# Patient Record
Sex: Female | Born: 1955 | Race: Black or African American | Hispanic: No | Marital: Single | Smoking: Former smoker
Health system: Southern US, Academic
[De-identification: ages and names within clinical notes are randomized; demographics above are authoritative.]

## PROBLEM LIST (undated history)

## (undated) DIAGNOSIS — Z9889 Other specified postprocedural states: Secondary | ICD-10-CM

## (undated) DIAGNOSIS — I10 Essential (primary) hypertension: Secondary | ICD-10-CM

## (undated) DIAGNOSIS — G629 Polyneuropathy, unspecified: Secondary | ICD-10-CM

## (undated) DIAGNOSIS — G8929 Other chronic pain: Secondary | ICD-10-CM

## (undated) DIAGNOSIS — K760 Fatty (change of) liver, not elsewhere classified: Secondary | ICD-10-CM

## (undated) DIAGNOSIS — M797 Fibromyalgia: Secondary | ICD-10-CM

## (undated) DIAGNOSIS — K632 Fistula of intestine: Secondary | ICD-10-CM

## (undated) DIAGNOSIS — E079 Disorder of thyroid, unspecified: Secondary | ICD-10-CM

## (undated) DIAGNOSIS — R002 Palpitations: Secondary | ICD-10-CM

## (undated) DIAGNOSIS — M4800 Spinal stenosis, site unspecified: Secondary | ICD-10-CM

## (undated) DIAGNOSIS — E119 Type 2 diabetes mellitus without complications: Secondary | ICD-10-CM

## (undated) DIAGNOSIS — I499 Cardiac arrhythmia, unspecified: Secondary | ICD-10-CM

## (undated) DIAGNOSIS — R112 Nausea with vomiting, unspecified: Secondary | ICD-10-CM

## (undated) DIAGNOSIS — N189 Chronic kidney disease, unspecified: Secondary | ICD-10-CM

## (undated) DIAGNOSIS — Z87448 Personal history of other diseases of urinary system: Secondary | ICD-10-CM

## (undated) DIAGNOSIS — E041 Nontoxic single thyroid nodule: Secondary | ICD-10-CM

## (undated) DIAGNOSIS — K5792 Diverticulitis of intestine, part unspecified, without perforation or abscess without bleeding: Secondary | ICD-10-CM

## (undated) DIAGNOSIS — K219 Gastro-esophageal reflux disease without esophagitis: Secondary | ICD-10-CM

## (undated) DIAGNOSIS — D649 Anemia, unspecified: Secondary | ICD-10-CM

## (undated) DIAGNOSIS — E785 Hyperlipidemia, unspecified: Secondary | ICD-10-CM

## (undated) DIAGNOSIS — R6889 Other general symptoms and signs: Secondary | ICD-10-CM

## (undated) DIAGNOSIS — Z932 Ileostomy status: Secondary | ICD-10-CM

## (undated) HISTORY — PX: ANKLE SURGERY: SHX546

## (undated) HISTORY — PX: HX HYSTERECTOMY: SHX81

## (undated) HISTORY — PX: ILEOSTOMY: SHX1783

## (undated) HISTORY — PX: COLONOSCOPY: WVUENDOPRO10

---

## 2020-04-01 NOTE — Patient Instructions (Signed)
 Seek immediate emergency medical attention if you experience sudden vision loss or eye pain, severe or worsening abdominal pain, difficulty swallowing, stiff neck, shortness of breath, coughing or vomiting up blood, chest pain, increased fever, unexplained weight loss, or blood in stool.  Follow up with Primary Care Physician as scheduled.

## 2020-10-18 IMAGING — MR MRI LUMBAR SPINE WITHOUT CONTRAST
5 of 6 series · 32 of 48 positions shown · IV contrast (gadolinium)
Comparison: None available.

﻿EXAM:  [id number]   MRI LUMBAR SPINE WITHOUT CONTRAST
INDICATION: Chronic lower back pain with bilateral lower extremity radiculopathy.
TECHNIQUE: Multiplanar multisequential MRI of the lumbar spine was performed without gadolinium contrast.

[Series 15: T2 · sagittal · 4.0mm · 1.02mm/px · 6 of 13 slices shown (1 of 3)]
[im 1/13]
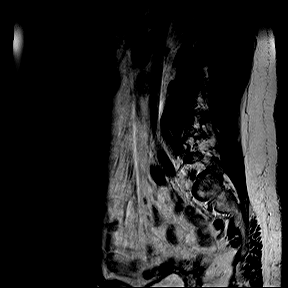
[im 3/13]
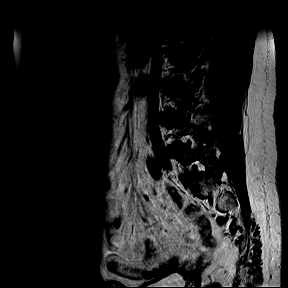
[im 5/13]
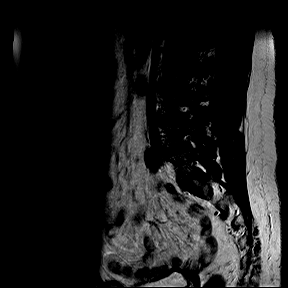
[im 8/13]
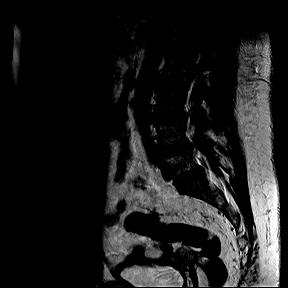
[im 10/13]
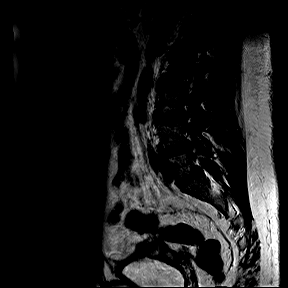
[im 13/13]
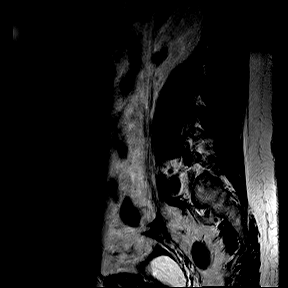

[Series 17: T1 · sagittal · 4.0mm · 1.02mm/px · 6 of 13 slices shown (1 of 2)]
[im 1/13]
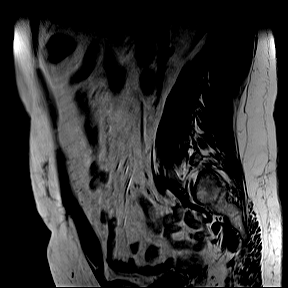
[im 3/13]
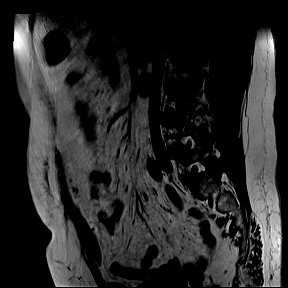
[im 5/13]
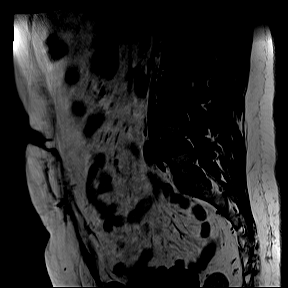
[im 8/13]
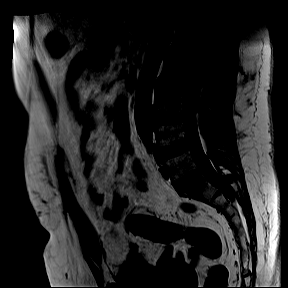
[im 10/13]
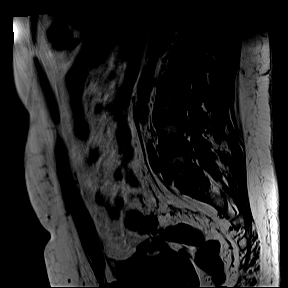
[im 13/13]
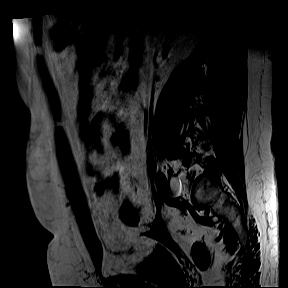

[Series 20: T2 · axial · 4.0mm · 0.52mm/px · z∈[-44,+158]mm · 11 of 23 slices shown (2 of 3)]
[im 1/23]
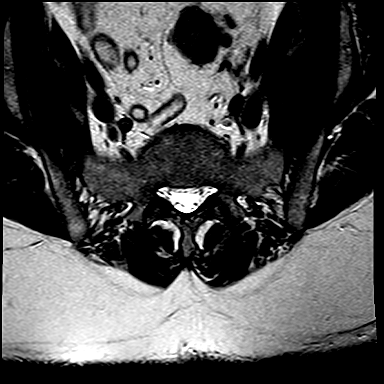
[im 3/23]
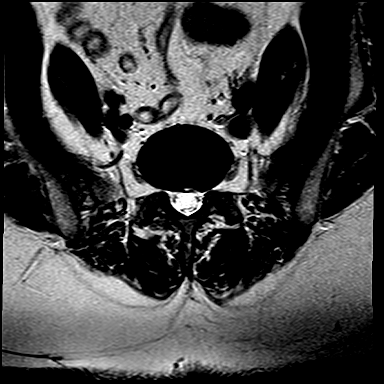
[im 5/23]
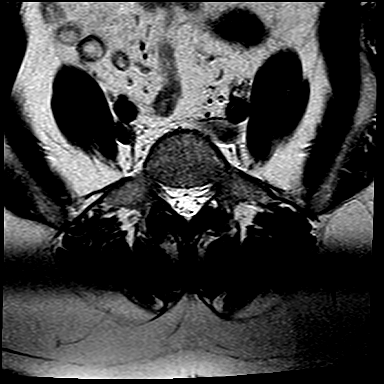
[im 7/23]
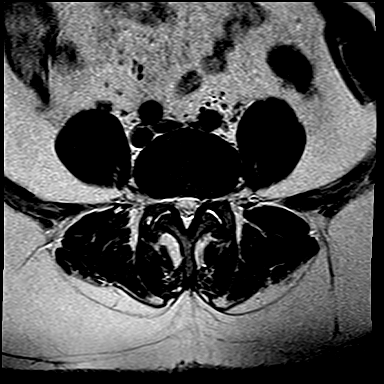
[im 9/23]
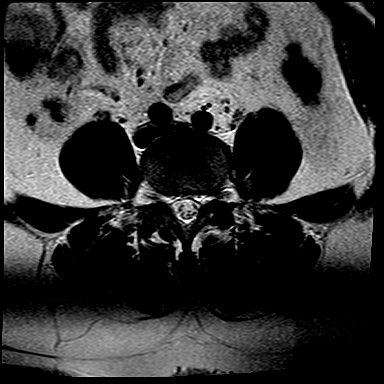
[im 12/23]
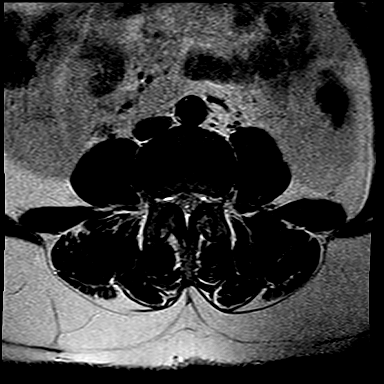
[im 14/23]
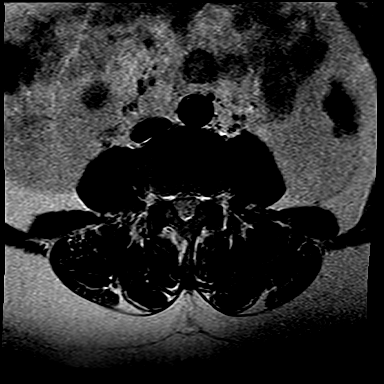
[im 16/23]
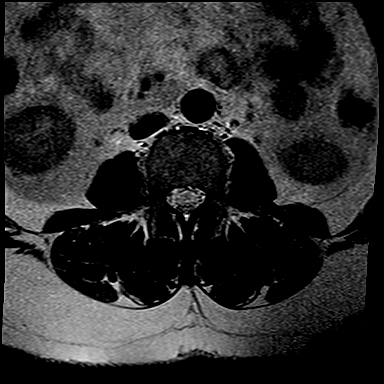
[im 18/23]
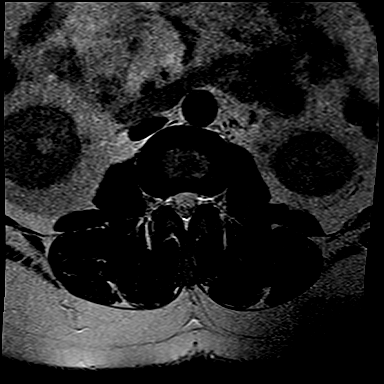
[im 20/23]
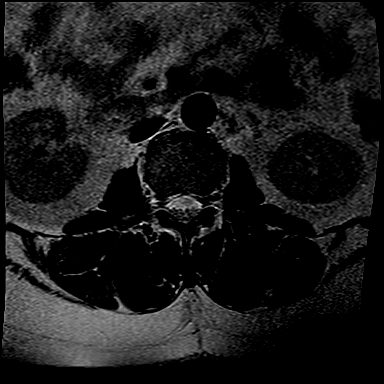
[im 23/23]
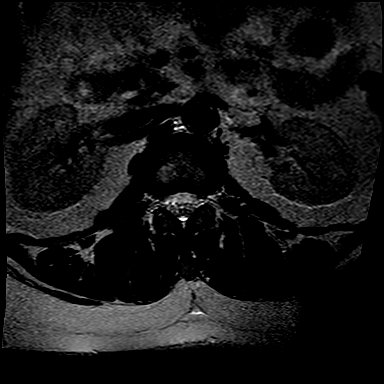

[Series 21: T1 · axial · 4.0mm · 0.52mm/px · 1 of 23 slices shown (2 of 2)]
[im 1/23]
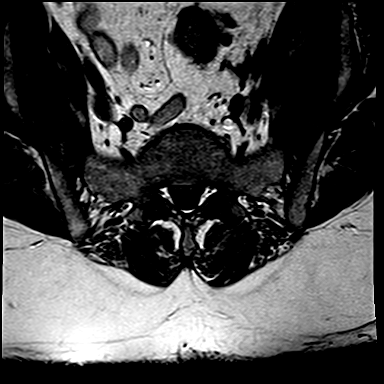

[Series 22: T2 · coronal · 5.0mm · 0.82mm/px · 8 of 18 slices shown (3 of 3)]
[im 1/18]
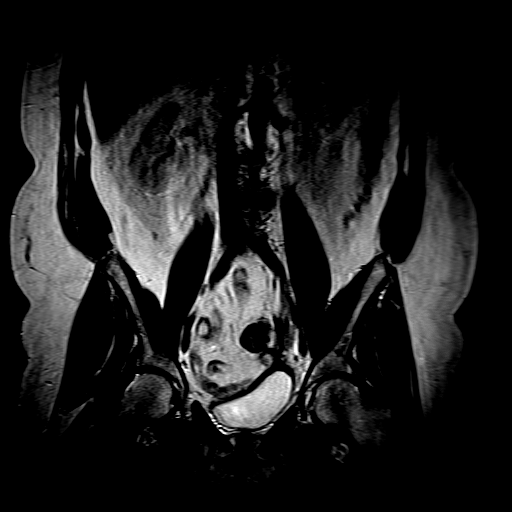
[im 3/18]
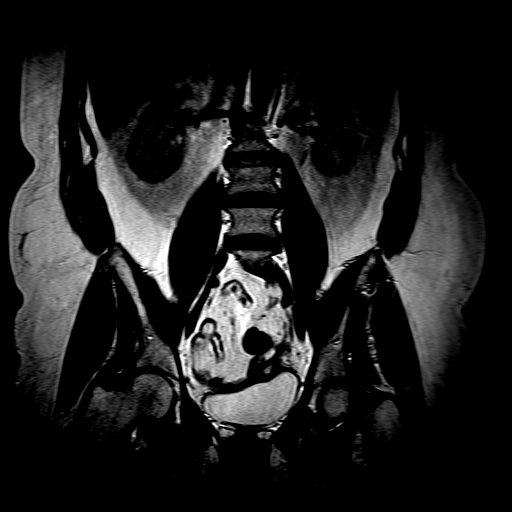
[im 5/18]
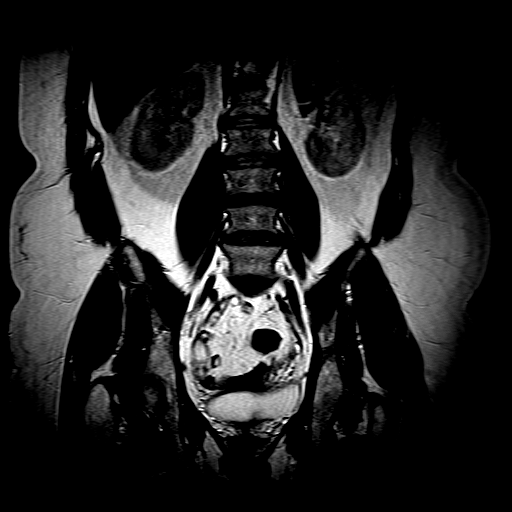
[im 8/18]
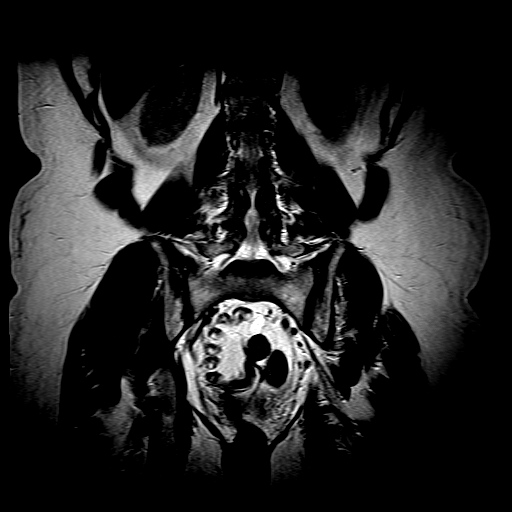
[im 10/18]
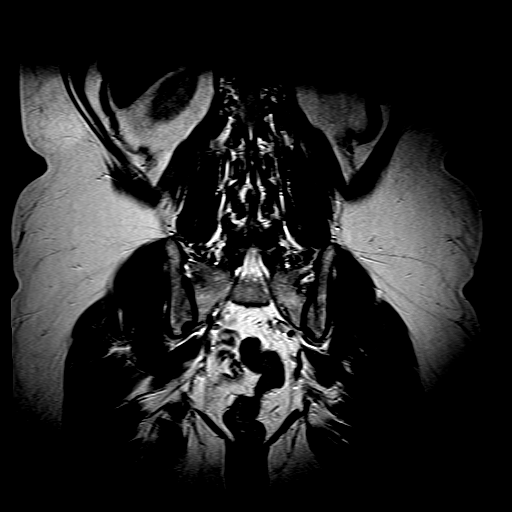
[im 13/18]
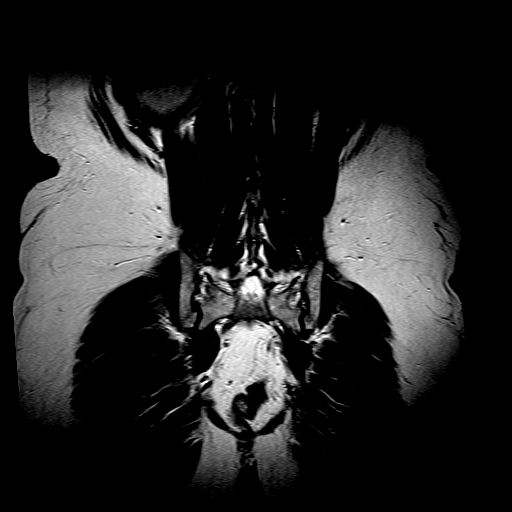
[im 15/18]
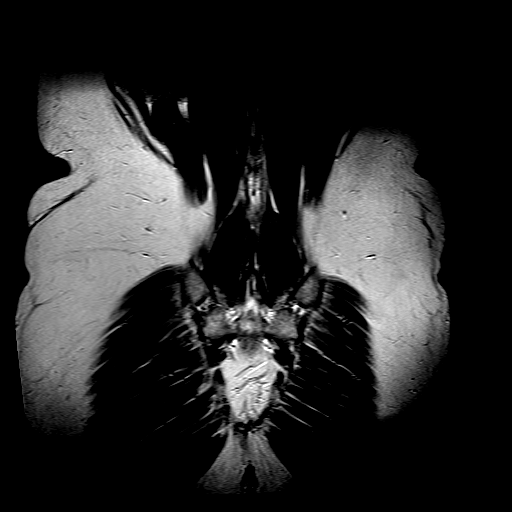
[im 18/18]
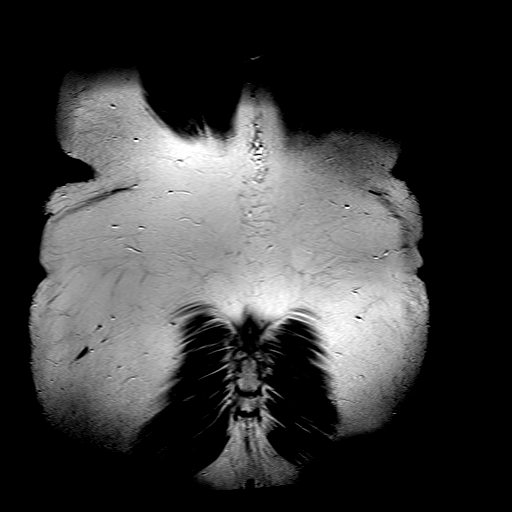

[32 of 48 positions shown; findings below may reference images not displayed]

FINDINGS: Vertebral bodies are normal in height, alignment and signal intensity. There is no acute fracture or subluxation. Distal spinal cord is not well visualized due to excessive motion. Spinal canal is congenitally narrow.

L1-2 and L2-3 levels are unremarkable.

At L3-4 level, there is mild-to-moderate bilateral neural foraminal stenosis from facet arthropathy and bulging annulus.

At L4-5 level, there is moderate bilateral neural foraminal stenosis from facet arthropathy and bulging annulus.

At L5-S1 level, there is a minimal bulging annulus with associated annular fissure, minimally effacing the ventral thecal sac. There is mild bilateral neural foraminal stenosis from facet arthropathy without nerve root impingement.

Paraspinal soft tissues are unremarkable.
IMPRESSION: 1. No significant disc herniation or spinal stenosis at any level. 

2. Multilevel neural foraminal stenosis as detailed above.

## 2021-01-31 IMAGING — MR MRI CERVICAL SPINE WITHOUT CONTRAST
4 of 7 series · 23 of 48 positions shown · IV contrast (gadolinium)
Comparison: None available.

﻿EXAM:  [id number]   MRI CERVICAL SPINE WITHOUT CONTRAST
INDICATION: Chronic neck pain.
TECHNIQUE: Multiplanar multisequential MRI of the cervical spine was performed without gadolinium contrast.

[Series 5: T2 · sagittal · 3.0mm · 0.75mm/px · 7 of 15 slices shown (1 of 2)]
[im 1/15]
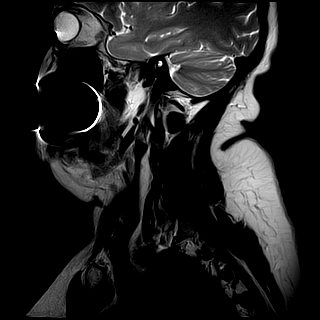
[im 3/15]
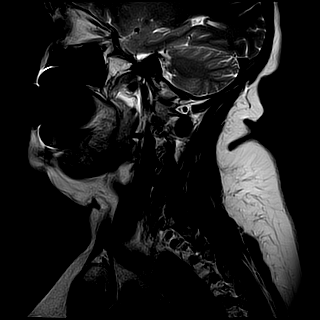
[im 5/15]
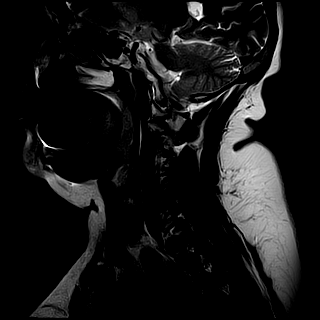
[im 8/15]
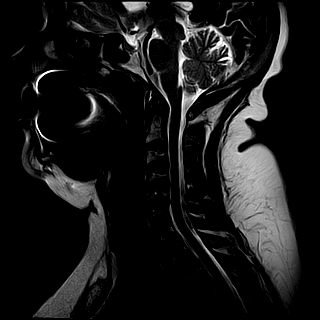
[im 10/15]
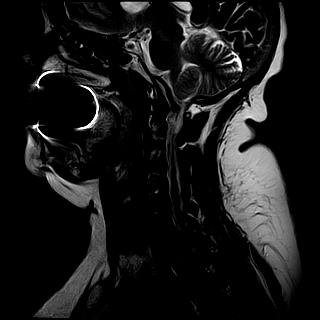
[im 12/15]
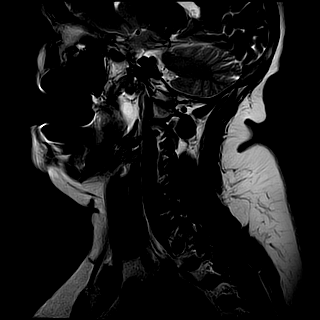
[im 15/15]
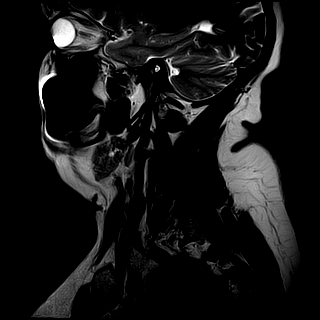

[Series 6: T1 · sagittal · 3.0mm · 0.47mm/px · 5 of 15 slices shown (1 of 2)]
[im 1/15]
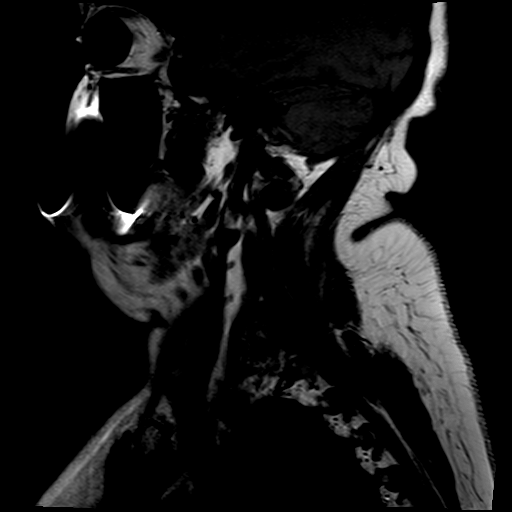
[im 3/15]
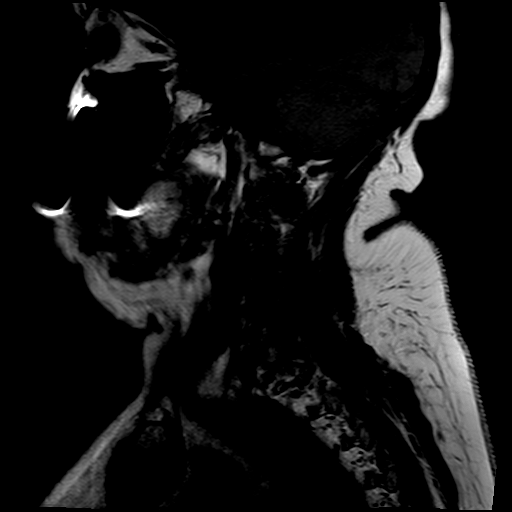
[im 5/15]
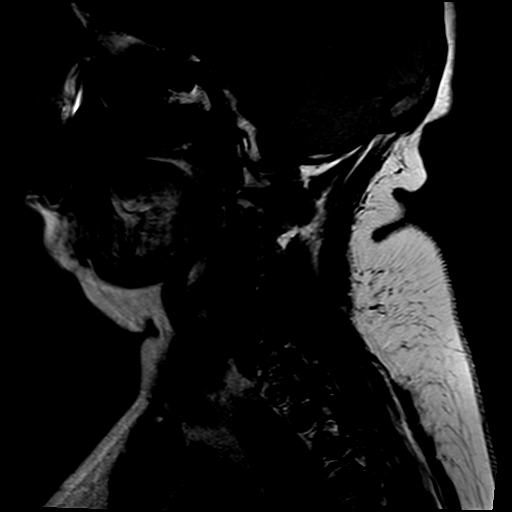
[im 8/15]
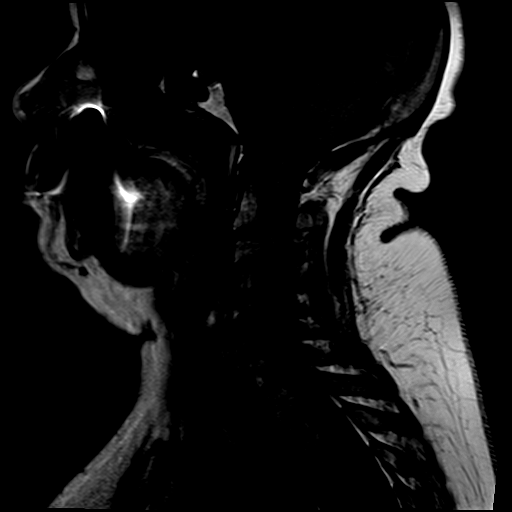
[im 12/15]
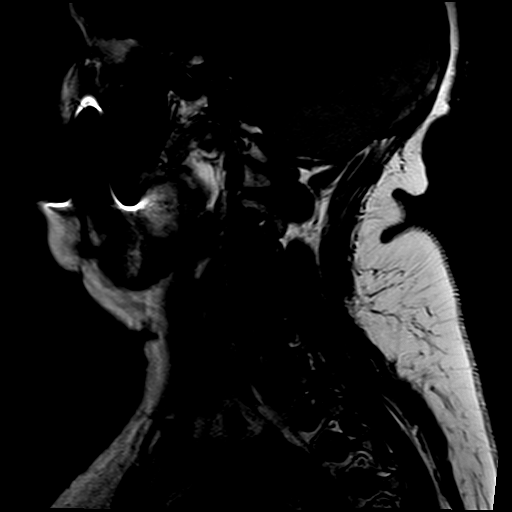

[Series 9: T2 · axial · 3.0mm · 0.39mm/px · z∈[-114,-0]mm · 8 of 18 slices shown (2 of 2)]
[im 1/18]
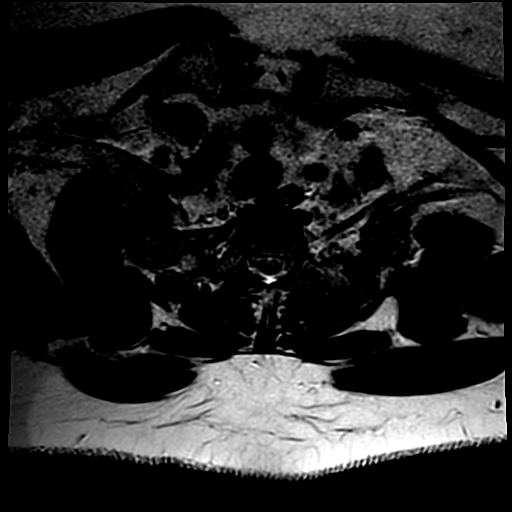
[im 3/18]
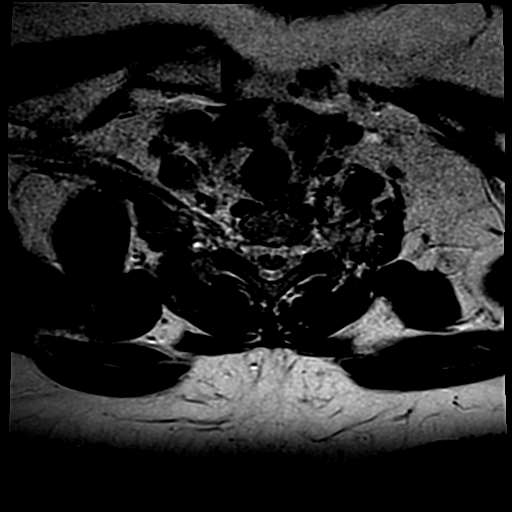
[im 5/18]
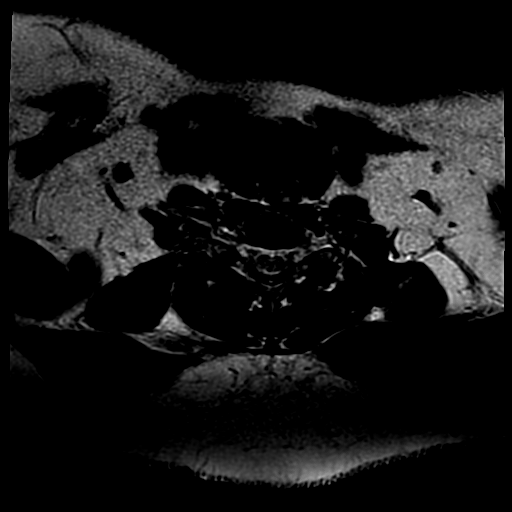
[im 8/18]
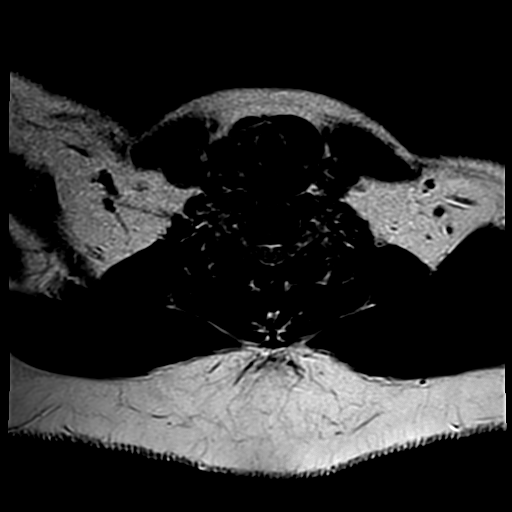
[im 10/18]
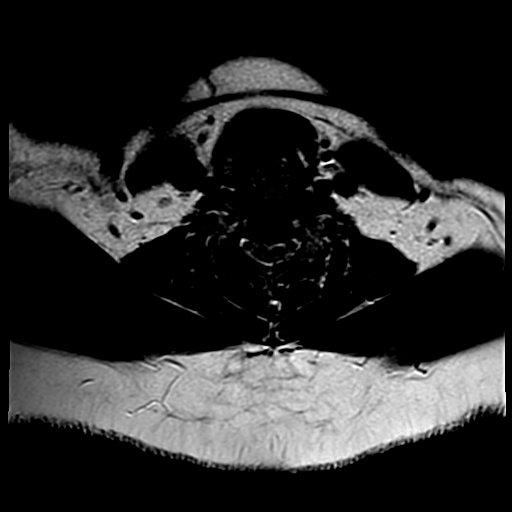
[im 13/18]
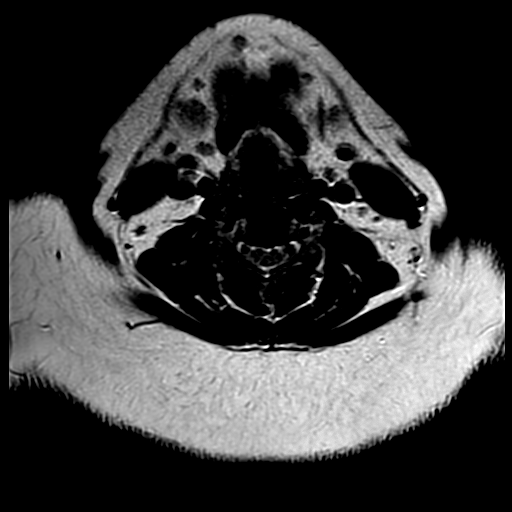
[im 15/18]
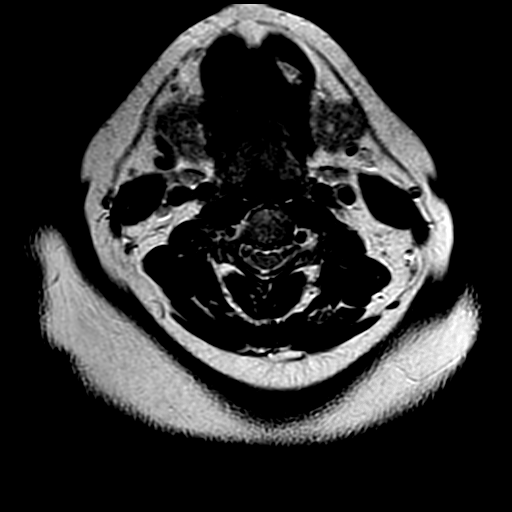
[im 18/18]
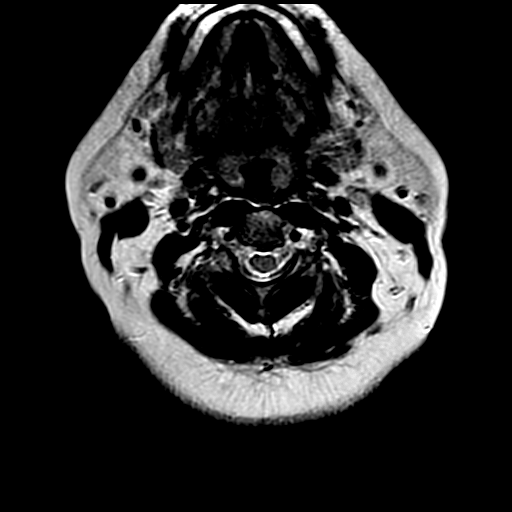

[Series 10: T1 · sagittal · 3.0mm · 0.47mm/px · 3 of 15 slices shown (2 of 2)]
[im 3/15]
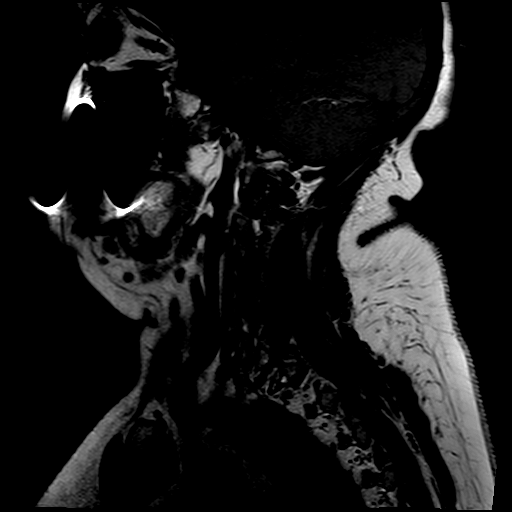
[im 9/15]
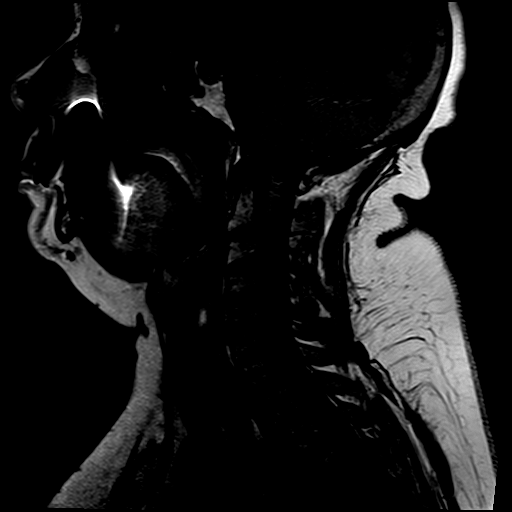
[im 15/15]
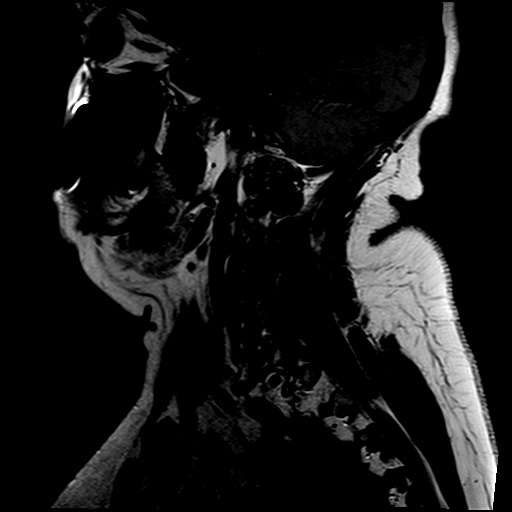

[23 of 48 positions shown; findings below may reference images not displayed]

FINDINGS: Vertebral bodies are normal in height, alignment and signal intensity. There is no acute fracture or subluxation. Visualized spinal cord is normal in signal intensity without evidence of compression at any level.

C2-3 level is unremarkable.

At C3-4 level, there is a small broad-based central disc osteophyte complex with near complete effacement of the ventral CSF. There is no significant neural foraminal stenosis.

At C4-5 level, there is a small broad-based central disc osteophyte complex with near complete effacement of the ventral CSF. There is mild right neural foraminal stenosis from uncovertebral joint hypertrophy.

At C5-6 level, there is a small broad-based central disc osteophyte complex with near complete effacement of the ventral CSF. There is no significant neural foraminal stenosis.

At C6-7 level, there is a minimal bulging annulus, minimally effacing the ventral CSF. There is no significant neural foraminal stenosis.

At C7-T1 level, there is a small broad-based central disc bulge, mildly effacing the ventral CSF. There is no significant neural foraminal stenosis.

Paraspinal soft tissues are unremarkable.
IMPRESSION: 1. Small disc osteophyte complexes at C3-4, C4-5 and C5-6 levels with near complete effacement of the ventral CSF at these levels. 

2. Multilevel neural foraminal stenosis as detailed above.

## 2021-11-28 ENCOUNTER — Inpatient Hospital Stay (HOSPITAL_COMMUNITY): Payer: Self-pay | Admitting: INTERNAL MEDICINE

## 2021-12-01 ENCOUNTER — Other Ambulatory Visit: Payer: Self-pay

## 2022-07-26 ENCOUNTER — Encounter (HOSPITAL_BASED_OUTPATIENT_CLINIC_OR_DEPARTMENT_OTHER): Payer: Self-pay | Admitting: Surgical Critical Care

## 2022-07-26 ENCOUNTER — Other Ambulatory Visit (HOSPITAL_BASED_OUTPATIENT_CLINIC_OR_DEPARTMENT_OTHER): Payer: Self-pay | Admitting: Surgical Critical Care

## 2022-07-26 DIAGNOSIS — Z9049 Acquired absence of other specified parts of digestive tract: Secondary | ICD-10-CM

## 2022-07-26 DIAGNOSIS — K5792 Diverticulitis of intestine, part unspecified, without perforation or abscess without bleeding: Secondary | ICD-10-CM

## 2022-08-09 ENCOUNTER — Encounter (HOSPITAL_BASED_OUTPATIENT_CLINIC_OR_DEPARTMENT_OTHER): Payer: Self-pay | Admitting: Surgical Critical Care

## 2022-08-10 ENCOUNTER — Inpatient Hospital Stay
Admission: RE | Admit: 2022-08-10 | Discharge: 2022-08-10 | Disposition: A | Discharge status: Home / Self Care | Payer: 59 | Source: Ambulatory Visit | Attending: Surgical Critical Care | Admitting: Surgical Critical Care

## 2022-08-10 ENCOUNTER — Other Ambulatory Visit (HOSPITAL_BASED_OUTPATIENT_CLINIC_OR_DEPARTMENT_OTHER): Payer: Self-pay | Admitting: Surgical Critical Care

## 2022-08-10 ENCOUNTER — Other Ambulatory Visit: Payer: Self-pay

## 2022-08-10 DIAGNOSIS — Z9049 Acquired absence of other specified parts of digestive tract: Secondary | ICD-10-CM

## 2022-08-10 DIAGNOSIS — Z933 Colostomy status: Secondary | ICD-10-CM | POA: Insufficient documentation

## 2022-08-10 DIAGNOSIS — K5792 Diverticulitis of intestine, part unspecified, without perforation or abscess without bleeding: Secondary | ICD-10-CM | POA: Insufficient documentation

## 2022-08-25 ENCOUNTER — Encounter (HOSPITAL_COMMUNITY): Payer: Self-pay | Admitting: Surgical Critical Care

## 2022-08-25 ENCOUNTER — Other Ambulatory Visit (HOSPITAL_COMMUNITY): Payer: Self-pay | Admitting: Surgical Critical Care

## 2022-08-25 DIAGNOSIS — Z01818 Encounter for other preprocedural examination: Secondary | ICD-10-CM

## 2022-08-25 NOTE — OR PreOp (Signed)
Debra Lloyd   Pre-Admission Testing Nurse: [phone]  Surgery Date: 09/06/2022. PAT APPT: [facility]: 09/04/2022 AT 3:00; ARRIVE AT 2:30 FOR REGISTRATION.  Arrive at: [facility]  Procedure: CLOSURE COLOSTOMY: [phone] (CPT)    Bring a complete and accurate list of your medications (including dosages) AND past medical and surgical history. This helps to ensure you received the safest care possible.   If you are taking any over-the-counter substances, such as herbal supplements, diet pills, pain relievers or other non prescription medication, please notify the Pre-Admission Testing Nurse, your doctor and any other health team member interviewing you or performing any assessment. DO NOT take any over-the-counter medications for 8 days prior to your procedure, unless otherwise instructed by your physician. Failure to disclose this information could be life threatening.   DO NOT eat or drink anything after midnight prior to surgery including: NO GUM, NO MINTS, NO TOBACCO OR TOBACCO PRODUCTS OF ANY KIND - 24 HOURS PRIOR TO SURGERY. (including but not limited to: cigarettes, cigars, chewing tobacco, snuff, pipe, vaping etc.), Approved medications may be taken with a sip of water the morning of your surgery. You may brush your teeth the morning of your surgery.   Do not wear make-up, lotions, or colored fingernail polish the day of surgery.   Leave all jewelry (including piercing) at home due to the risk of injury. No jewelry will be permitted in the Operating Room. ALL piercings must be removed, metal, plastic, silicone, etc.   If you have any prosthetics, please inform your pre-admission testing nurse.   Wear loose-fitting, comfortable clothing.   If you require reading glasses or hearing aids, please bring them with you the day of surgery. Your dentures can remain in place until immediately before your procedure, but do not use any denture adhesive. They will be returned once you are awake and alert  in the Recovery Room.   If you require interpretive services, our facility will provide these for you. Please ask if you have any questions regarding this service.   If required for your procedure, you will be given a Hibiclens Soap Kit with instructions for use at home. The kit is to be used the night before AND the morning of surgery following the instructions given.   When you arrive at the area specified, the nurse will prepare you for your procedure and complete any additional blood work/paperwork. An IV will be started as needed.   One support person/family member will be able to join you in the pre-operative area once the per-op nurse has prepared you for your procedure. Children may have both parents with them.   When you are taken to the Operating Room, your family will be shown to the waiting room. Your family will be provided with a case number so they can track the progress of your surgical case while in the waiting area. When your procedure has been completed, your surgeon will speak to your family.   After your procedure, you will be taken to the Recovery Room until you are awake and alert (30 minutes - 2 hours). You will then be transported to the appropriate area for further care.   You can expect to experience some discomfort and possible nausea. Discuss this with your nurse so that medications can be given, if appropriate.   Some procedures may require you to assume a special position in the recovery period. We will explain this if it applies to you.   It may also be necessary  for you to have tubes after surgery (catheter, drains, from incision).   After surgery, you will need to breathe deeply and to cough (helping to prevent a form of pneumonia that can occur after having general anesthesia).   Medications to be taken the morning of surgery with a sip of water:   ATENOLOL.   Last dose of Metformin 48 hours prior to surgery: NA   Additional comments: NA   If you use CPAP/BiPAP, bring  your machine with you on day of surgery if you will be staying overnight.  For those patients having same-day surgery:   Arrange for transportation home with a responsible adult who will remain with you for 24 hours after your procedure. The adult must be present with you prior to the procedure. You will not be allowed to proceed with the planned surgery if you are not accompanied by an appropriate post-procedure caregiver.   When you are able to tolerate fluids, urinate, etc, you will receive discharge instructions for your care at home (both verbal and written).   After completing all of the above, you will be discharged to home with instructions to follow-up   If you have any additional questions, please contact your surgeon's office or the Pre-Admission Testing Nurse at [phone].   Please follow any specific instructions from you surgeon's office concerning aspirin/ other blood thinners, physical therapy, or bowel preps/enemas, etc.    SIGNATURE: ___________________________________________________.    DATE:  ___________________________________________________.

## 2022-08-25 NOTE — Nurses Notes (Signed)
Phone interview. Patient denies rash or wounds on skin.

## 2022-08-25 NOTE — Nurses Notes (Signed)
PAT call w/Patient. Patient denies chest pain. Follows w/Dr. Caralyn Guile for cardiology. Last office visit approx 10/2021. Next follow up: 11/2022.    Fax sent to Dr. Teresita Madura office requesting OV notes/Testing Reports.

## 2022-09-04 ENCOUNTER — Other Ambulatory Visit: Payer: Self-pay

## 2022-09-04 ENCOUNTER — Ambulatory Visit
Admission: RE | Admit: 2022-09-04 | Discharge: 2022-09-04 | Disposition: A | Discharge status: Home / Self Care | Payer: 59 | Source: Ambulatory Visit | Attending: Surgical Critical Care | Admitting: Surgical Critical Care

## 2022-09-04 ENCOUNTER — Other Ambulatory Visit (HOSPITAL_COMMUNITY): Payer: 59

## 2022-09-04 DIAGNOSIS — Z01818 Encounter for other preprocedural examination: Secondary | ICD-10-CM

## 2022-09-04 DIAGNOSIS — R9431 Abnormal electrocardiogram [ECG] [EKG]: Secondary | ICD-10-CM

## 2022-09-04 DIAGNOSIS — I2129 ST elevation (STEMI) myocardial infarction involving other sites: Secondary | ICD-10-CM

## 2022-09-04 LAB — CBC WITH DIFF
BASOPHIL #: <0.1 10*3/uL (ref <=0.20)
BASOPHIL %: 0.7 %
EOSINOPHIL #: 0.22 10*3/uL (ref <=0.50)
EOSINOPHIL %: 2.3 %
HCT: 38.6 % (ref 34.8–46.0)
HGB: 12.1 g/dL (ref 11.5–16.0)
IMMATURE GRANULOCYTE #: <0.1 10*3/uL (ref <0.10)
IMMATURE GRANULOCYTE %: 0.2 % (ref 0.0–1.0)
LYMPHOCYTE #: 4.46 10*3/uL (ref 1.00–4.80)
LYMPHOCYTE %: 46.6 %
MCH: 26.9 pg (ref 26.0–32.0)
MCHC: 31.3 g/dL (ref 31.0–35.5)
MCV: 86 fL (ref 78.0–100.0)
MONOCYTE #: 0.66 10*3/uL (ref 0.20–1.10)
MONOCYTE %: 6.9 %
MPV: 11.2 fL (ref 8.7–12.5)
NEUTROPHIL #: 4.15 10*3/uL (ref 1.50–7.70)
NEUTROPHIL %: 43.3 %
PLATELETS: 276 10*3/uL (ref 150–400)
RBC: 4.49 10*6/uL (ref 3.85–5.22)
RDW-CV: 14.4 % (ref 11.5–15.5)
WBC: 9.6 10*3/uL (ref 3.7–11.0)

## 2022-09-04 LAB — BASIC METABOLIC PANEL
BUN/CREA RATIO: 14
BUN: 23 mg/dL (ref 8–23)
CALCIUM: 9.5 mg/dL (ref 8.3–10.7)
CHLORIDE: 106 mmol/L (ref 96–106)
CO2 TOTAL: 20 mmol/L — ABNORMAL LOW (ref 22–30)
CREATININE: 1.64 mg/dL — ABNORMAL HIGH (ref 0.50–0.90)
ESTIMATED GFR: 35 mL/min/{1.73_m2} — ABNORMAL LOW (ref >90)
GLUCOSE: 145 mg/dL — ABNORMAL HIGH (ref 74–109)
POTASSIUM: 4.3 mmol/L (ref 3.2–5.0)
SODIUM: 139 mmol/L (ref 133–144)

## 2022-09-04 LAB — ECG 12 LEAD
Atrial Rate: 64 {beats}/min
Calculated P Axis: 25 degrees
Calculated R Axis: 5 degrees
Calculated T Axis: 28 degrees
PR Interval: 186 ms
QRS Duration: 70 ms
QT Interval: 392 ms
QTC Calculation: 404 ms
Ventricular rate: 64 {beats}/min

## 2022-09-06 ENCOUNTER — Inpatient Hospital Stay (HOSPITAL_COMMUNITY): Payer: 59 | Admitting: ANESTHESIOLOGY

## 2022-09-06 ENCOUNTER — Other Ambulatory Visit: Payer: Self-pay

## 2022-09-06 ENCOUNTER — Encounter (HOSPITAL_COMMUNITY)
Admission: RE | Disposition: A | Discharge status: Home / Self Care | Payer: Self-pay | Source: Ambulatory Visit | Attending: Surgical Critical Care

## 2022-09-06 ENCOUNTER — Encounter (HOSPITAL_COMMUNITY): Payer: Self-pay | Admitting: Surgical Critical Care

## 2022-09-06 ENCOUNTER — Inpatient Hospital Stay
Admission: RE | Admit: 2022-09-06 | Discharge: 2022-09-08 | DRG: 331 | Disposition: A | Discharge status: Home / Self Care | Payer: 59 | Source: Ambulatory Visit | Attending: Surgical Critical Care | Admitting: Surgical Critical Care

## 2022-09-06 DIAGNOSIS — E1122 Type 2 diabetes mellitus with diabetic chronic kidney disease: Secondary | ICD-10-CM | POA: Diagnosis present

## 2022-09-06 DIAGNOSIS — K219 Gastro-esophageal reflux disease without esophagitis: Secondary | ICD-10-CM | POA: Diagnosis present

## 2022-09-06 DIAGNOSIS — K632 Fistula of intestine: Secondary | ICD-10-CM

## 2022-09-06 DIAGNOSIS — Z87891 Personal history of nicotine dependence: Secondary | ICD-10-CM

## 2022-09-06 DIAGNOSIS — Z79899 Other long term (current) drug therapy: Secondary | ICD-10-CM

## 2022-09-06 DIAGNOSIS — Z794 Long term (current) use of insulin: Secondary | ICD-10-CM

## 2022-09-06 DIAGNOSIS — Z885 Allergy status to narcotic agent status: Secondary | ICD-10-CM

## 2022-09-06 DIAGNOSIS — N189 Chronic kidney disease, unspecified: Secondary | ICD-10-CM | POA: Diagnosis present

## 2022-09-06 DIAGNOSIS — I129 Hypertensive chronic kidney disease with stage 1 through stage 4 chronic kidney disease, or unspecified chronic kidney disease: Secondary | ICD-10-CM | POA: Diagnosis present

## 2022-09-06 DIAGNOSIS — Z9889 Other specified postprocedural states: Principal | ICD-10-CM | POA: Diagnosis present

## 2022-09-06 DIAGNOSIS — Z91041 Radiographic dye allergy status: Secondary | ICD-10-CM

## 2022-09-06 DIAGNOSIS — Z432 Encounter for attention to ileostomy: Principal | ICD-10-CM

## 2022-09-06 DIAGNOSIS — Z8719 Personal history of other diseases of the digestive system: Secondary | ICD-10-CM

## 2022-09-06 HISTORY — DX: Cardiac arrhythmia, unspecified: I49.9

## 2022-09-06 HISTORY — DX: Gastro-esophageal reflux disease without esophagitis: K21.9

## 2022-09-06 HISTORY — DX: Fatty (change of) liver, not elsewhere classified: K76.0

## 2022-09-06 HISTORY — DX: Personal history of other diseases of urinary system: Z87.448

## 2022-09-06 HISTORY — DX: Other chronic pain: G89.29

## 2022-09-06 HISTORY — DX: Anemia, unspecified: D64.9

## 2022-09-06 HISTORY — DX: Ileostomy status: Z93.2

## 2022-09-06 HISTORY — DX: Palpitations: R00.2

## 2022-09-06 HISTORY — DX: Hyperlipidemia, unspecified: E78.5

## 2022-09-06 HISTORY — DX: Fistula of intestine: K63.2

## 2022-09-06 HISTORY — DX: Fibromyalgia: M79.7

## 2022-09-06 HISTORY — DX: Polyneuropathy, unspecified: G62.9

## 2022-09-06 HISTORY — DX: Other specified postprocedural states: Z98.890

## 2022-09-06 HISTORY — DX: Nausea with vomiting, unspecified: R11.2

## 2022-09-06 HISTORY — DX: Disorder of thyroid, unspecified: E07.9

## 2022-09-06 HISTORY — DX: Essential (primary) hypertension: I10

## 2022-09-06 HISTORY — DX: Chronic kidney disease, unspecified: N18.9

## 2022-09-06 HISTORY — DX: Nontoxic single thyroid nodule: E04.1

## 2022-09-06 HISTORY — DX: Diverticulitis of intestine, part unspecified, without perforation or abscess without bleeding: K57.92

## 2022-09-06 HISTORY — DX: Other general symptoms and signs: R68.89

## 2022-09-06 HISTORY — DX: Type 2 diabetes mellitus without complications: E11.9

## 2022-09-06 HISTORY — DX: Spinal stenosis, site unspecified: M48.00

## 2022-09-06 LAB — POC BLOOD GLUCOSE (RESULTS)
GLUCOSE, POC: 118 mg/dl — ABNORMAL HIGH (ref 70–100)
GLUCOSE, POC: 127 mg/dl — ABNORMAL HIGH (ref 70–100)

## 2022-09-06 SURGERY — CLOSURE COLOSTOMY
Anesthesia: General | Wound class: Clean Contaminated Wounds-The respiratory, GI, Genital, or urinary

## 2022-09-06 MED ORDER — ONDANSETRON HCL (PF) 4 MG/2 ML INJECTION SOLUTION
INTRAMUSCULAR | Status: AC
Start: 2022-09-06 — End: 2022-09-06
  Filled 2022-09-06: qty 2

## 2022-09-06 MED ORDER — HYDROMORPHONE 0.5 MG/0.5 ML INJECTION SYRINGE
0.2500 mg | INJECTION | INTRAMUSCULAR | Status: DC | PRN
Start: 2022-09-06 — End: 2022-09-06

## 2022-09-06 MED ORDER — ONDANSETRON 4 MG DISINTEGRATING TABLET
4.0000 mg | ORAL_TABLET | Freq: Once | ORAL | Status: AC
Start: 2022-09-06 — End: 2022-09-06
  Administered 2022-09-06: 4 mg via ORAL

## 2022-09-06 MED ORDER — BUPIVACAINE (PF) 0.25 % (2.5 MG/ML) INJECTION SOLUTION
INTRAMUSCULAR | Status: AC
Start: 2022-09-06 — End: 2022-09-06
  Filled 2022-09-06: qty 30

## 2022-09-06 MED ORDER — PROPOFOL 10 MG/ML IV BOLUS
INJECTION | Freq: Once | INTRAVENOUS | Status: DC | PRN
Start: 2022-09-06 — End: 2022-09-06
  Administered 2022-09-06: 150 mg via INTRAVENOUS

## 2022-09-06 MED ORDER — ALBUTEROL SULFATE 2.5 MG/3 ML (0.083 %) SOLUTION FOR NEBULIZATION
2.5000 mg | INHALATION_SOLUTION | Freq: Once | RESPIRATORY_TRACT | Status: DC | PRN
Start: 2022-09-06 — End: 2022-09-06

## 2022-09-06 MED ORDER — ONDANSETRON HCL (PF) 4 MG/2 ML INJECTION SOLUTION
4.0000 mg | Freq: Four times a day (QID) | INTRAMUSCULAR | Status: DC | PRN
Start: 2022-09-06 — End: 2022-09-08

## 2022-09-06 MED ORDER — ROCURONIUM 10 MG/ML INTRAVENOUS SOLUTION
Freq: Once | INTRAVENOUS | Status: DC | PRN
Start: 2022-09-06 — End: 2022-09-06
  Administered 2022-09-06: 10 mg via INTRAVENOUS
  Administered 2022-09-06: 40 mg via INTRAVENOUS

## 2022-09-06 MED ORDER — MEPERIDINE (PF) 25 MG/ML INJECTION SYRINGE
12.5000 mg | INJECTION | INTRAMUSCULAR | Status: DC | PRN
Start: 2022-09-06 — End: 2022-09-06

## 2022-09-06 MED ORDER — MAGNESIUM SULFATE 2 GRAM/50 ML (4 %) IN WATER INTRAVENOUS PIGGYBACK
INJECTION | INTRAVENOUS | Status: AC
Start: 2022-09-06 — End: 2022-09-06
  Filled 2022-09-06: qty 50

## 2022-09-06 MED ORDER — LACTATED RINGERS INTRAVENOUS SOLUTION
INTRAVENOUS | Status: DC
Start: 2022-09-06 — End: 2022-09-08

## 2022-09-06 MED ORDER — SUGAMMADEX 100 MG/ML INTRAVENOUS SOLUTION
INTRAVENOUS | Status: AC
Start: 2022-09-06 — End: 2022-09-06
  Filled 2022-09-06: qty 2

## 2022-09-06 MED ORDER — LABETALOL 20 MG/4 ML (5 MG/ML) INTRAVENOUS SYRINGE
5.0000 mg | INJECTION | Freq: Once | INTRAVENOUS | Status: DC | PRN
Start: 2022-09-06 — End: 2022-09-06

## 2022-09-06 MED ORDER — DEXAMETHASONE SODIUM PHOSPHATE 4 MG/ML INJECTION SOLUTION
Freq: Once | INTRAMUSCULAR | Status: DC | PRN
Start: 2022-09-06 — End: 2022-09-06
  Administered 2022-09-06: 5 mg via INTRAVENOUS

## 2022-09-06 MED ORDER — DEXMEDETOMIDINE 100 MCG/ML INTRAVENOUS SOLUTION
Freq: Once | INTRAVENOUS | Status: DC | PRN
Start: 2022-09-06 — End: 2022-09-06
  Administered 2022-09-06: 10 ug via INTRAVENOUS

## 2022-09-06 MED ORDER — PROCHLORPERAZINE EDISYLATE 10 MG/2 ML (5 MG/ML) INJECTION SOLUTION
5.0000 mg | Freq: Once | INTRAMUSCULAR | Status: DC | PRN
Start: 2022-09-06 — End: 2022-09-06
  Administered 2022-09-06: 5 mg via INTRAVENOUS
  Filled 2022-09-06: qty 2

## 2022-09-06 MED ORDER — ACETAMINOPHEN 500 MG TABLET
ORAL_TABLET | ORAL | Status: AC
Start: 2022-09-06 — End: 2022-09-06
  Filled 2022-09-06: qty 2

## 2022-09-06 MED ORDER — MIDAZOLAM 1 MG/ML INJECTION WRAPPER
INTRAMUSCULAR | Status: AC
Start: 2022-09-06 — End: 2022-09-06
  Filled 2022-09-06: qty 2

## 2022-09-06 MED ORDER — LACTATED RINGERS INTRAVENOUS SOLUTION
INTRAVENOUS | Status: DC
Start: 2022-09-06 — End: 2022-09-06
  Administered 2022-09-06: 0 mL via INTRAVENOUS

## 2022-09-06 MED ORDER — HYDROMORPHONE 1 MG/ML INJECTION WRAPPER
0.5000 mg | INJECTION | INTRAMUSCULAR | Status: DC | PRN
Start: 2022-09-06 — End: 2022-09-06

## 2022-09-06 MED ORDER — SODIUM CHLORIDE 0.9 % (FLUSH) INJECTION SYRINGE
3.0000 mL | INJECTION | Freq: Three times a day (TID) | INTRAMUSCULAR | Status: DC
Start: 2022-09-06 — End: 2022-09-06

## 2022-09-06 MED ORDER — SUGAMMADEX 100 MG/ML INTRAVENOUS SOLUTION
Freq: Once | INTRAVENOUS | Status: DC | PRN
Start: 2022-09-06 — End: 2022-09-06
  Administered 2022-09-06: 200 mg via INTRAVENOUS

## 2022-09-06 MED ORDER — FAMOTIDINE 20 MG TABLET
ORAL_TABLET | ORAL | Status: AC
Start: 2022-09-06 — End: 2022-09-06
  Filled 2022-09-06: qty 1

## 2022-09-06 MED ORDER — IPRATROPIUM 0.5 MG-ALBUTEROL 3 MG (2.5 MG BASE)/3 ML NEBULIZATION SOLN
3.0000 mL | INHALATION_SOLUTION | Freq: Once | RESPIRATORY_TRACT | Status: DC | PRN
Start: 2022-09-06 — End: 2022-09-06

## 2022-09-06 MED ORDER — ENALAPRILAT 1.25 MG/ML INTRAVENOUS SOLUTION
1.2500 mg | Freq: Once | INTRAVENOUS | Status: DC
Start: 2022-09-06 — End: 2022-09-06
  Filled 2022-09-06: qty 1

## 2022-09-06 MED ORDER — ONDANSETRON 4 MG DISINTEGRATING TABLET
ORAL_TABLET | ORAL | Status: AC
Start: 2022-09-06 — End: 2022-09-06
  Filled 2022-09-06: qty 1

## 2022-09-06 MED ORDER — OXYCODONE-ACETAMINOPHEN 5 MG-325 MG TABLET
ORAL_TABLET | ORAL | Status: AC
Start: 2022-09-06 — End: 2022-09-06
  Filled 2022-09-06: qty 1

## 2022-09-06 MED ORDER — LACTATED RINGERS INTRAVENOUS SOLUTION
INTRAVENOUS | Status: DC | PRN
Start: 2022-09-06 — End: 2022-09-06
  Administered 2022-09-06: 0 via INTRAVENOUS

## 2022-09-06 MED ORDER — HYDROMORPHONE 1 MG/ML INJECTION WRAPPER
INJECTION | INTRAMUSCULAR | Status: AC
Start: 2022-09-06 — End: 2022-09-06
  Filled 2022-09-06: qty 1

## 2022-09-06 MED ORDER — EPHEDRINE SULFATE 50 MG/ML INTRAVENOUS SOLUTION
Freq: Once | INTRAVENOUS | Status: DC | PRN
Start: 2022-09-06 — End: 2022-09-06
  Administered 2022-09-06: 5 mg via INTRAVENOUS
  Administered 2022-09-06: 10 mg via INTRAVENOUS

## 2022-09-06 MED ORDER — OXYCODONE-ACETAMINOPHEN 5 MG-325 MG TABLET
1.0000 | ORAL_TABLET | ORAL | Status: DC | PRN
Start: 2022-09-06 — End: 2022-09-08
  Administered 2022-09-06: 1 via ORAL

## 2022-09-06 MED ORDER — ACETAMINOPHEN 500 MG TABLET
1000.0000 mg | ORAL_TABLET | Freq: Once | ORAL | Status: AC
Start: 2022-09-06 — End: 2022-09-06
  Administered 2022-09-06: 1000 mg via ORAL

## 2022-09-06 MED ORDER — APREPITANT 40 MG CAPSULE
40.0000 mg | ORAL_CAPSULE | Freq: Once | ORAL | Status: AC
Start: 2022-09-06 — End: 2022-09-06
  Administered 2022-09-06: 40 mg via ORAL

## 2022-09-06 MED ORDER — FAMOTIDINE 20 MG TABLET
20.0000 mg | ORAL_TABLET | Freq: Once | ORAL | Status: AC
Start: 2022-09-06 — End: 2022-09-06
  Administered 2022-09-06: 20 mg via ORAL

## 2022-09-06 MED ORDER — LIDOCAINE (PF) 100 MG/5 ML (2 %) INTRAVENOUS SYRINGE
INJECTION | Freq: Once | INTRAVENOUS | Status: DC | PRN
Start: 2022-09-06 — End: 2022-09-06
  Administered 2022-09-06: 60 mg via INTRAVENOUS

## 2022-09-06 MED ORDER — APREPITANT 40 MG CAPSULE
ORAL_CAPSULE | ORAL | Status: AC
Start: 2022-09-06 — End: 2022-09-06
  Filled 2022-09-06: qty 1

## 2022-09-06 MED ORDER — ONDANSETRON HCL (PF) 4 MG/2 ML INJECTION SOLUTION
Freq: Once | INTRAMUSCULAR | Status: DC | PRN
Start: 2022-09-06 — End: 2022-09-06
  Administered 2022-09-06: 3 mg via INTRAVENOUS

## 2022-09-06 MED ORDER — ONDANSETRON HCL (PF) 4 MG/2 ML INJECTION SOLUTION
4.0000 mg | Freq: Once | INTRAMUSCULAR | Status: DC | PRN
Start: 2022-09-06 — End: 2022-09-06
  Administered 2022-09-06: 4 mg via INTRAVENOUS

## 2022-09-06 MED ORDER — CEFOXITIN 2 GRAM/50 ML IN DEXTROSE(ISO-OSMOTIC) INTRAVENOUS PIGGYBACK
INJECTION | Freq: Once | INTRAVENOUS | Status: DC | PRN
Start: 2022-09-06 — End: 2022-09-06
  Administered 2022-09-06: 2 g via INTRAVENOUS

## 2022-09-06 MED ORDER — SODIUM CHLORIDE 0.9 % (FLUSH) INJECTION SYRINGE
3.0000 mL | INJECTION | INTRAMUSCULAR | Status: DC | PRN
Start: 2022-09-06 — End: 2022-09-06

## 2022-09-06 MED ORDER — LACTATED RINGERS INTRAVENOUS SOLUTION
INTRAVENOUS | Status: DC
Start: 2022-09-06 — End: 2022-09-08
  Administered 2022-09-06: 0 mL via INTRAVENOUS

## 2022-09-06 MED ORDER — HYDROMORPHONE 0.5 MG/0.5 ML INJECTION SYRINGE
0.5000 mg | INJECTION | INTRAMUSCULAR | Status: DC | PRN
Start: 2022-09-06 — End: 2022-09-08

## 2022-09-06 MED ORDER — ENOXAPARIN 30 MG/0.3 ML SUBCUTANEOUS SYRINGE
30.0000 mg | INJECTION | SUBCUTANEOUS | Status: DC
Start: 2022-09-07 — End: 2022-09-08
  Administered 2022-09-06: 30 mg via SUBCUTANEOUS
  Administered 2022-09-08: 0 mg via SUBCUTANEOUS
  Filled 2022-09-06 (×3): qty 0.3

## 2022-09-06 MED ORDER — PHENYLEPHRINE 10 MG/ML INJECTION SOLUTION
Freq: Once | INTRAMUSCULAR | Status: DC | PRN
Start: 2022-09-06 — End: 2022-09-06
  Administered 2022-09-06: 100 ug via INTRAVENOUS

## 2022-09-06 MED ORDER — FENTANYL (PF) 50 MCG/ML INJECTION WRAPPER
INJECTION | Freq: Once | INTRAMUSCULAR | Status: DC | PRN
Start: 2022-09-06 — End: 2022-09-06
  Administered 2022-09-06 (×2): 50 ug via INTRAVENOUS

## 2022-09-06 MED ORDER — FENTANYL (PF) 50 MCG/ML INJECTION SOLUTION
INTRAMUSCULAR | Status: AC
Start: 2022-09-06 — End: 2022-09-06
  Filled 2022-09-06: qty 4

## 2022-09-06 SURGICAL SUPPLY — 9 items
CONV USE ITEM 306873 - COVER STAND 23IN MAYO CNVRT PLASTIC REINF STRL LF  DISP (DRAPE/PACKS/SHEETS/OR TOWEL) ×1 IMPLANT
COVER STAND 23IN MAYO CNVRT PLASTIC REINF STRL LF  DISP (DRAPE/PACKS/SHEETS/OR TOWEL) ×1
GOWN SURG XL REG LGTH REINF HKLP CLSR RGLN SLEEVE NONST LF DISP GRN ULTRA SMS (DRAPE/PACKS/SHEETS/OR TOWEL) ×1 IMPLANT
PK SURG PROCEDURE CUST MJR ABDOMINAL (MATMA)923 THOM MEM HOSP LF (CUSTOM TRAYS & PACK) ×1 IMPLANT
RELOAD STPLR 60MM TI 3MM 3.5MM 4MM MED THKTIS ARTC KNIFE BLADE LGR ANVIL BCKT STRL LF  DISP EGIA (SUTURE AIDS) ×2 IMPLANT
STAPLER INTERNAL 6CMX4MM TI SHORT UNIV THN VAS TISS ARTC HNDL LRG BCKT STRL LF  DISP EGIA ENDOS 12MM (SUTURE AIDS) ×1 IMPLANT
SUTURE 0 CT1 PROLENE 30IN BLU MONOF NONAB (SUTURE/WOUND CLOSURE) ×2 IMPLANT
SUTURE 0 CT2 PROLENE 30IN BLU MONOF NONAB (SUTURE/WOUND CLOSURE) ×1 IMPLANT
SUTURE 3-0 PS-1 ETHILON 18.0I_N BLK NYLON MONOF NYL N/ABSB (SUTURE/WOUND CLOSURE) ×2 IMPLANT

## 2022-09-06 NOTE — H&P (Signed)
[facility]  History and Physical      Patient Name: Debra Lloyd  Age: 67 y.o.  DOB: June 24, 1955  Date of Surgery: 09/06/2022   MRN: [id number]    Admitting Physician: Jetty Peeks, MD [phone]     Surgical Procedure: Reversal of ileostomy      History of Present Illness: The patient is a 67 year old female with a past medical history of CKD, diverticulitis, hypertension, hyperlipidemia, and type 2 diabetes. She presents today for reversal of ileostomy. Her original surgery was for her ileostomy was February 22, 2022. The indication for the surgery was for worsening diverticulitis. She reports no complications with the surgery or ileostomy. She was recently assessed by Dr. Andrey Cota who recommended the patient undergo reversal of ileostomy on 09/06/22.    Past Medical History  No current outpatient medications on file.      Allergies   Allergen Reactions    Sulfa (Sulfonamides)      blisters    Contrast Dye [Iv Contrast] Itching    Morphine Itching     Past Medical History:   Diagnosis Date    Anemia, unspecified     Chronic kidney disease     uncertain of stage    Chronic pain     Diverticulitis     Dysrhythmias     Esophageal reflux     Essential hypertension     Fatty liver     Fibromyalgia     Fistula of intestine     History of kidney disease     Hyperlipidemia     Neck problem     Neuropathy (CMS HCC)     feet    Palpitations     PONV (postoperative nausea and vomiting)     Presence of ileostomy (CMS HCC)     Spinal stenosis, multilevel     Thyroid disorder     hypothyroidism - no meds prescribed currently    Thyroid nodule     Type 2 diabetes mellitus (CMS HCC)          Past Surgical History:   Procedure Laterality Date    ANKLE SURGERY Right     no hardware present    CESAREAN SECTION      x 1    COLONOSCOPY      multiple    HX HYSTERECTOMY      ILEOSTOMY           Family Medical History:    None         Social History     Socioeconomic History    Marital status: Single   Tobacco Use     Smoking status: Former     Types: Cigarettes    Smokeless tobacco: Never    Tobacco comments:     Former vaping x few months.   Substance and Sexual Activity    Alcohol use: Not Currently    Drug use: Never   Other Topics Concern    Ability to Walk 1 Flight of Steps without SOB/CP Yes    Routine Exercise No    Ability to Walk 2 Flight of Steps without SOB/CP Yes    Unable to Ambulate No    Total Care No    Ability To Do Own ADL's Yes    Uses Walker No    Uses Cane No          IMMUNIZATION:   Immunization History   Administered Date(s) Administered  Covid-19 Vaccine,Pfizer-BioNTech,Purple Top,25yrs+ 06/11/2019, 07/04/2019        Review of Systems   Constitutional:  Negative for chills, fever and weight loss.   Respiratory:  Negative for cough and shortness of breath.    Cardiovascular:  Negative for chest pain, palpitations and leg swelling.   Gastrointestinal:  Negative for nausea and vomiting.        Watery output from ileostomy   Genitourinary:  Negative for dysuria and hematuria.   Musculoskeletal:  Positive for myalgias.   Skin:  Negative for rash.        Patient reports irritation below her ileostomy.   Neurological:  Positive for headaches. Negative for dizziness.        BP (!) 166/60   Pulse 61   Temp 36.3 C (97.3 F)   Resp 16   Ht 1.676 m (5\' 6" )   Wt 88.5 kg (195 lb)   SpO2 100%   BMI 31.47 kg/m         PHYSICAL EXAMINATION:  CONSTITUTIONAL: Cooperative, Well-developed, well-nourished, in no acute distress.  SKIN: Warm and dry with good turgor; no rash or open wounds observed  HEENT: head is normocephalic and atraumatic. Extraocular motions are intact.  External ear is normal.  Nares are patent.  NECK: Supple without lymphadenopathy.  Non-tender to palpation  RESPIRATORY: Clear to auscultation bilaterally without wheezes or rhonchi.  No tachypnea or use of accessory muscles  CARDIOVASCULAR: Heart is regular rate and rhythm without murmur.  Pulses are +2/4 and symmetrical bilaterally.    ABDOMEN:  Bowel sounds are normoactive.  Abdomen is soft, non-tender, and non distended.  No rebound tenderness noted.   RECTAL: Exam deferred.  GENITOURINARY: Exam deferred.  MUSCULOSKELETAL: No obvious musculoskeletal abnormalities.  Free active range of motion without any obvious limitation or pain.   NEUROLOGIC: Cranial nerves are grossly intact without acute deficit.  No focal weakness.  Normal speech.  No fasciculations.  Moving all extremities.  Vision and hearing grossly intact.  PSYCHIATRIC: Cooperative. Normal affect.  No suicidal or homicidal ideation.        Assessment and Plan:  The patient has agreed to undergo a reversal of ileostomy performed by Dr. Andrey Cota on 09/06/22.      Josue Hector, PA-C  09/06/2022

## 2022-09-06 NOTE — OR Surgeon (Addendum)
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]  Diverticulitis [K57.92]     Post-Operative Diagnosis: istula of intestine, excluding rectum and anus [K63.2]Diverticulitis [K57.92]    Procedure(s)/Description:  Loop ileostomy closure  Attending Surgeon: Jetty Peeks, MD     Assistant: Arther Dames, PA-C  Assisted with exposure and retraction, suturing and closure of the wounds  Anesthesia Staff:  Anesthesiologist: Milas Hock, MD  CRNA: Conni Slipper, CRNA    Anesthesia Type: General     Estimated Blood Loss: less than 50 ml    Implants: * No implants in log * * No implants in log *     Specimens Removed:   Order Name Source Comment Collection Info Order Time   SURGICAL PATHOLOGY SPECIMEN Ileum Pre-op diagnosis:  Fistula of intestine, 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    Indications:  Patient is a 67 y.o. female who presents for the patient is status post Hartmann's reversal which at the time of the procedure had a small leak at the colo rectal anastomosis for this reason of diverting ileostomy was performed.  She is recently underwent workup with evaluation of the anastomosis which is good in his here for reversal of her loop ileostomy .    Description of Procedure/Operation:  Patient was brought to the operative suite general anesthesia was induced without complication her abdomen was prepped and draped routine sterile manner.  Following this using electrocautery and scalpel 3 mm border of skin was  excised around the ileostomy.  This was taken down through the skin to the through the dermis with electrocautery following this the loop ileostomy was dissected from the subcu all the way down to the fascia.  Attachments to the fascia were dissected free this was continued down into the abdomen until it was completely the loop was completely feel free from the peritoneum and surrounding tissue once this was complete was able to do a side-to-side anastomosis with a purple load GIA stapler.  This was done with a 45 mm GIA a stapler purple load.  This was complete common channel was closed with a 2nd firing of the GIA stapler.  Anastomosis was then reduced back into the abdomen the wound was irrigated fascia was grasped with a Kocher hours in 3 figure-of-eight Prolenes were used to close the fascia.  Once this was complete the wound was irrigated out skin was approximated laterally on both sides with the interrupted 3-0 nylon stitch leaving the middle of cm and a half open for delayed primary closure.  Estimated blood loss was less than 30 cc no drains were placed patient tolerated procedure well without any major complications sent to recovery room in stable condition  Jetty Peeks, MD

## 2022-09-06 NOTE — Nurses Notes (Signed)
1450 report given to simonaminotti RN.

## 2022-09-06 NOTE — Anesthesia Transfer of Care (Signed)
ANESTHESIA TRANSFER OF CARE   Debra Lloyd is a 67 y.o. ,female, Weight: 88.5 kg (195 lb)   had Procedure(s):  CLOSURE COLOSTOMY  performed  09/06/22   Primary Service: Jetty Peeks, MD    Past Medical History:   Diagnosis Date    Anemia, unspecified     Chronic kidney disease     uncertain of stage    Chronic pain     Diverticulitis     Dysrhythmias     Esophageal reflux     Essential hypertension     Fatty liver     Fibromyalgia     Fistula of intestine     History of kidney disease     Hyperlipidemia     Neck problem     Neuropathy (CMS HCC)     feet    Palpitations     PONV (postoperative nausea and vomiting)     Presence of ileostomy (CMS HCC)     Spinal stenosis, multilevel     Thyroid disorder     hypothyroidism - no meds prescribed currently    Thyroid nodule     Type 2 diabetes mellitus (CMS HCC)       Allergy History as of 09/06/22       MORPHINE         Noted Status Severity Type Reaction    08/25/22 1441 Elswick, Datto, RN 08/25/22 Active Low  Itching              SULFA (SULFONAMIDES)         Noted Status Severity Type Reaction    08/25/22 1442 Elswick, Efraim Kaufmann, RN 08/25/22 Active       Comments: blisters               IV CONTRAST         Noted Status Severity Type Reaction    08/25/22 1443 Elswick, Gordon, RN 08/25/22 Active Low  Itching                  I completed my transfer of care / handoff to the receiving personnel during which we discussed:    Report given to: Arbutus Ped, APRN,AGACNP-BC                                                                 Last OR Temp: Temperature: 36.3 C (97.3 F)  ABG:  POTASSIUM   Date Value Ref Range Status   09/04/2022 4.3 3.2 - 5.0 mmol/L Final     CALCIUM   Date Value Ref Range Status   09/04/2022 9.5 8.3 - 10.7 mg/dL Final     Calculated P Axis   Date Value Ref Range Status   09/04/2022 25 degrees Final     Calculated R Axis   Date Value Ref Range Status   09/04/2022 5 degrees Final     Calculated T Axis   Date Value Ref Range Status   09/04/2022 28  degrees Final     Airway:* No LDAs found *  Blood pressure (!) 148/77, pulse 62, temperature 36.3 C (97.3 F), resp. rate 14, height 1.676 m (5\' 6" ), weight 88.5 kg (195 lb), SpO2 100%.

## 2022-09-06 NOTE — Anesthesia Preprocedure Evaluation (Signed)
ANESTHESIA PRE-OP EVALUATION  Planned Procedure: CLOSURE COLOSTOMY  Review of Systems    PONV       patient summary reviewed  nursing notes reviewed        Pulmonary     Cardiovascular    Hypertension, dysrhythmias and hyperlipidemia ,No peripheral edema,        GI/Hepatic/Renal    GERD and liver disease        Endo/Other    anemia and diverticulitis,   type 2 diabetes    Neuro/Psych/MS    fibromyalgia, Neck problems    peripheral neuropathy,  Cancer                        Physical Assessment      Airway       Mallampati: III    TM distance: 3 FB    Neck ROM: full  Mouth Opening: good.      No endotracheal tube present      Dental           (+) partials           Pulmonary    Breath sounds clear to auscultation  (-) no rhonchi, no decreased breath sounds, no wheezes, no rales and no stridor     Cardiovascular    Rhythm: regular  Rate: Normal  (-) no friction rub, carotid bruit is not present, no peripheral edema and no murmur     Other findings              Plan  ASA 2     Planned anesthesia type: general     general anesthesia with endotracheal tube intubation      PONV Plan:  I plan to administer pharmcologic prophalaxis antiemetics  POV PLAN:   plan for postoperative opioid use    SLEEP APNEA  Patient is at risk of obstructive sleep apnea and Education provided regarding risk of obstructive sleep apnea        Intravenous induction     Anesthesia issues/risks discussed are: Dental Injuries, Nerve Injuries, PONV, Blood Loss, Aspiration, Sore Throat, Intraoperative Awareness/ Recall, Cardiac Events/MI and Post-op Cognitive Dysfunction.  Anesthetic plan and risks discussed with patient  signed consent obtained          Patient's NPO status is appropriate for Anesthesia.           Plan discussed with CRNA.

## 2022-09-06 NOTE — Care Plan (Signed)
Problem: Adult Inpatient Plan of Care  Goal: Patient-Specific Goal (Individualized)  Outcome: Ongoing (see interventions/notes)  Flowsheets (Taken 09/06/2022 1600)  Individualized Care Needs: PRN pain management  Anxieties, Fears or Concerns: pain management  Patient-Specific Goals (Include Timeframe): discharge soon  Plan of Care Reviewed With: patient     Problem: Adult Inpatient Plan of Care  Goal: Plan of Care Review  Outcome: Ongoing (see interventions/notes)     Problem: Adult Inpatient Plan of Care  Goal: Optimal Comfort and Wellbeing  Outcome: Ongoing (see interventions/notes)     Problem: Adult Inpatient Plan of Care  Goal: Absence of Hospital-Acquired Illness or Injury  Outcome: Ongoing (see interventions/notes)

## 2022-09-06 NOTE — Anesthesia Postprocedure Evaluation (Signed)
Anesthesia Post Op Evaluation    Patient: Debra Lloyd  Procedure(s):  CLOSURE COLOSTOMY    Last Vitals:Temperature: 36.5 C (97.7 F) (09/06/22 1017)  Heart Rate: 64 (09/06/22 1032)  BP (Non-Invasive): 132/63 (09/06/22 1032)  Respiratory Rate: 16 (09/06/22 1032)  SpO2: 100 % (09/06/22 1032)    No notable events documented.    Patient is sufficiently recovered from the effects of anesthesia to participate in the evaluation and has returned to their pre-procedure level.  Patient location during evaluation: PACU       Patient participation: complete - patient participated  Level of consciousness: awake and alert and responsive to verbal stimuli    Pain management: adequate  Airway patency: patent    Anesthetic complications: no  Cardiovascular status: acceptable  Respiratory status: acceptable  Hydration status: acceptable  Patient post-procedure temperature: Pt Normothermic   PONV Status: Absent

## 2022-09-07 DIAGNOSIS — K529 Noninfective gastroenteritis and colitis, unspecified: Secondary | ICD-10-CM

## 2022-09-07 LAB — CBC WITH DIFF
BASOPHIL #: <0.1 10*3/uL (ref <=0.20)
BASOPHIL %: 0.1 %
EOSINOPHIL #: <0.1 10*3/uL (ref <=0.50)
EOSINOPHIL %: 0 %
HCT: 38.2 % (ref 34.8–46.0)
HGB: 11.4 g/dL — ABNORMAL LOW (ref 11.5–16.0)
IMMATURE GRANULOCYTE #: <0.1 10*3/uL (ref <0.10)
IMMATURE GRANULOCYTE %: 0.4 % (ref 0.0–1.0)
LYMPHOCYTE #: 2.71 10*3/uL (ref 1.00–4.80)
LYMPHOCYTE %: 21.8 %
MCH: 26.5 pg (ref 26.0–32.0)
MCHC: 29.8 g/dL — ABNORMAL LOW (ref 31.0–35.5)
MCV: 88.6 fL (ref 78.0–100.0)
MONOCYTE #: 0.63 10*3/uL (ref 0.20–1.10)
MONOCYTE %: 5.1 %
MPV: 11.5 fL (ref 8.7–12.5)
NEUTROPHIL #: 9.02 10*3/uL — ABNORMAL HIGH (ref 1.50–7.70)
NEUTROPHIL %: 72.6 %
PLATELETS: 266 10*3/uL (ref 150–400)
RBC: 4.31 10*6/uL (ref 3.85–5.22)
RDW-CV: 14.5 % (ref 11.5–15.5)
WBC: 12.4 10*3/uL — ABNORMAL HIGH (ref 3.7–11.0)

## 2022-09-07 LAB — POC BLOOD GLUCOSE (RESULTS)
GLUCOSE, POC: 143 mg/dl — ABNORMAL HIGH (ref 70–100)
GLUCOSE, POC: 143 mg/dl — ABNORMAL HIGH (ref 70–100)
GLUCOSE, POC: 157 mg/dl — ABNORMAL HIGH (ref 70–100)

## 2022-09-07 LAB — BASIC METABOLIC PANEL
BUN/CREA RATIO: 15
BUN: 23 mg/dL (ref 8–23)
CALCIUM: 8.7 mg/dL (ref 8.3–10.7)
CHLORIDE: 106 mmol/L (ref 96–106)
CO2 TOTAL: 19 mmol/L — ABNORMAL LOW (ref 22–30)
CREATININE: 1.52 mg/dL — ABNORMAL HIGH (ref 0.50–0.90)
ESTIMATED GFR: 38 mL/min/{1.73_m2} — ABNORMAL LOW (ref >90)
GLUCOSE: 156 mg/dL — ABNORMAL HIGH (ref 74–109)
POTASSIUM: 4.5 mmol/L (ref 3.2–5.0)
SODIUM: 138 mmol/L (ref 133–144)

## 2022-09-07 LAB — SURGICAL PATHOLOGY SPECIMEN

## 2022-09-07 MED ORDER — INSULIN LISPRO 100 UNIT/ML SUB-Q SSIP VIAL
0.0000 [IU] | INJECTION | Freq: Four times a day (QID) | SUBCUTANEOUS | Status: DC
Start: 2022-09-07 — End: 2022-09-08
  Administered 2022-09-07: 3 [IU] via SUBCUTANEOUS
  Administered 2022-09-07 – 2022-09-08 (×3): 0 [IU] via SUBCUTANEOUS
  Filled 2022-09-07: qty 300

## 2022-09-07 MED ORDER — FAMOTIDINE 40 MG TABLET
40.0000 mg | ORAL_TABLET | Freq: Every evening | ORAL | Status: DC | PRN
Start: 2022-09-07 — End: 2022-09-08
  Filled 2022-09-07: qty 1

## 2022-09-07 MED ORDER — DEXTROSE 50 % IN WATER (D50W) INTRAVENOUS SYRINGE
12.5000 g | INJECTION | INTRAVENOUS | Status: DC | PRN
Start: 2022-09-07 — End: 2022-09-08

## 2022-09-07 MED ORDER — DEXTROSE 40 % ORAL GEL
15.0000 g | ORAL | Status: DC | PRN
Start: 2022-09-07 — End: 2022-09-08
  Filled 2022-09-07: qty 15

## 2022-09-07 MED ORDER — LORAZEPAM 1 MG TABLET
1.0000 mg | ORAL_TABLET | Freq: Every evening | ORAL | Status: DC
Start: 2022-09-07 — End: 2022-09-08
  Administered 2022-09-07: 1 mg via ORAL
  Filled 2022-09-07: qty 1

## 2022-09-07 MED ORDER — GLUCAGON 1 MG SOLUTION FOR INJECTION
1.0000 mg | Freq: Once | INTRAMUSCULAR | Status: DC | PRN
Start: 2022-09-07 — End: 2022-09-08

## 2022-09-07 NOTE — Care Plan (Signed)
[place] [facility] Plan Note    Situation: Post Colostomy reversal    Intervention: dressing, Advance Diet    Response: Liquid diet tolerated    No complaints made. Dressing intact  with old drainage.  Tarri Abernethy, RN

## 2022-09-07 NOTE — Care Management Notes (Signed)
SW note  Pt was admitted for the reversal of her iliostomy. She reports feeling "great". She's had a closure and is able to pass gas. She lives at home with her adult daughter and 2 grand kids. She reports that her daughter is very helpful and is able to take care of everything she needs. She declined hh for incision care stating her daughter was the one who took care of her when she was initially sick before. She doesn't have any DME, O2, or HH. She was offered to create a MPOA but she declined. She is current with her PCP and it's correct in her record.   No other needs anticipated.   Loran Senters, MSW, [place]

## 2022-09-07 NOTE — Ancillary Notes (Signed)
09/07/22 0940   Referral Information   Date of Visit 09/07/22   Reason For Visit: Initial Visit   Spiritual Issues at Present Time None at Present time   Patient Request Prayer   Intervention/Pastoral Plan Provide prayer/sacraments   Patient Response To Treatment   Observed Response Mood Brightened   Patient Verbal Response Gratitude/ Thankfulness

## 2022-09-07 NOTE — Progress Notes (Signed)
[place] [facility]  Progress Note    Debra Lloyd  Date of service: 09/07/2022   Date of Admission:  09/06/2022    Hospital Day:  LOS: 1 day   Subjective:  Patient is postop day 1 after a loop ileostomy closure.  Pain control.  Tolerating clear liquid diet no nausea vomiting.  We will advance diet as tolerated.    Vital Signs:  Temp  Avg: 36.8 C (98.2 F)  Min: 36.4 C (97.5 F)  Max: 37.1 C (98.8 F)    Pulse  Avg: 57.2  Min: 50  Max: 65 BP  Min: 96/66  Max: 120/74   Resp  Avg: 16.8  Min: 15  Max: 18 SpO2  Avg: 99 %  Min: 96 %  Max: 100 %          Input/Output  No intake or output data in the 24 hours ending 09/07/22 1138 I/O last shift:  No intake/output data recorded.   Correction/SSIP insulin lispro (HumaLOG) 100 units/mL injection, 0-18 Units, Subcutaneous, 4x/day AC  dextrose (GLUTOSE) 40% oral gel, 15 g, Oral, Q15 Min PRN  dextrose 50% (0.5 g/mL) injection - syringe, 12.5 g, Intravenous, Q15 Min PRN  enoxaparin PF (LOVENOX) 30 mg/0.3 mL SubQ injection, 30 mg, Subcutaneous, Q24H  famotidine (PEPCID) tablet, 40 mg, Oral, HS PRN  glucagon (GLUCAGEN) injection 1 mg, 1 mg, IntraMUSCULAR, Once PRN  HYDROmorphone (DILAUDID) 0.5 mg/0.5 mL injection, 0.5 mg, Intravenous, Q2H PRN  LORazepam (ATIVAN) tablet, 1 mg, Oral, NIGHTLY  LR premix infusion, , Intravenous, Continuous  LR premix infusion, , Intravenous, Continuous  ondansetron (ZOFRAN) 2 mg/mL injection, 4 mg, Intravenous, Q6H PRN  oxyCODONE-acetaminophen (PERCOCET) 5-325mg  per tablet, 1 Tablet, Oral, Q4H PRN        Physical Exam:  GENERAL:  Alert and oriented, no acute distress  SKIN: Warm and dry.  CARDIAC: S1 and S2, no murmurs or gallops. No S3.  LUNGS: Clear to auscultation bilaterally. No wheezes or rhonchi. No accessory muscle use noted.  GI: Abdomen soft, mildly tender, mildly distended.  Hypoactive bowel sounds x 4.  Right lower quadrant dressing with some shadow drainage.  MUSCULOSKELETAL: Good muscle strength throughout.  EXTREMITIES: No  pedal edema noted. Distal pulses equal bilaterally.  NEUROLOGICAL: Cranial nerves grossly intact. No gross focal deficit.  PSYCHIATRIC: Appropriate mood and affect.     Labs:     Results for orders placed or performed during the hospital encounter of 09/06/22 (from the past 24 hour(s))   BASIC METABOLIC PANEL, NON-FASTING   Result Value Ref Range    SODIUM 138 133 - 144 mmol/L    POTASSIUM 4.5 3.2 - 5.0 mmol/L    CHLORIDE 106 96 - 106 mmol/L    CO2 TOTAL 19 (L) 22 - 30 mmol/L    CALCIUM 8.7 8.3 - 10.7 mg/dL    GLUCOSE 161 (H) 74 - 109 mg/dL    BUN 23 8 - 23 mg/dL    CREATININE 0.96 (H) 0.50 - 0.90 mg/dL    BUN/CREA RATIO 15     ESTIMATED GFR 38 (L) >90 mL/min/1.17m^2   CBC WITH DIFF   Result Value Ref Range    WBC 12.4 (H) 3.7 - 11.0 x10^3/uL    RBC 4.31 3.85 - 5.22 x10^6/uL    HGB 11.4 (L) 11.5 - 16.0 g/dL    HCT 04.5 40.9 - 81.1 %    MCV 88.6 78.0 - 100.0 fL    MCH 26.5 26.0 - 32.0 pg  MCHC 29.8 (L) 31.0 - 35.5 g/dL    RDW-CV 81.1 91.4 - 78.2 %    PLATELETS 266 150 - 400 x10^3/uL    MPV 11.5 8.7 - 12.5 fL    NEUTROPHIL % 72.6 %    LYMPHOCYTE % 21.8 %    MONOCYTE % 5.1 %    EOSINOPHIL % 0.0 %    BASOPHIL % 0.1 %    NEUTROPHIL # 9.02 (H) 1.50 - 7.70 x10^3/uL    LYMPHOCYTE # 2.71 1.00 - 4.80 x10^3/uL    MONOCYTE # 0.63 0.20 - 1.10 x10^3/uL    EOSINOPHIL # <0.10 <=0.50 x10^3/uL    BASOPHIL # <0.10 <=0.20 x10^3/uL    IMMATURE GRANULOCYTE % 0.4 0.0 - 1.0 %    IMMATURE GRANULOCYTE # <0.10 <0.10 x10^3/uL   POC BLOOD GLUCOSE (RESULTS)   Result Value Ref Range    GLUCOSE, POC 143 (H) 70 - 100 mg/dl           Assessment/ Plan:   Active Hospital Problems    Diagnosis    Primary Problem: Status post reversal of ileostomy     We will advance diet as tolerated.  Pain control.  Encourage ambulation and pulmonary toilet.  We will change abdominal dressing tomorrow.  Likely home tomorrow if continues to progress.    DVT/PE Prophylaxis: Enoxaparin    Dian Situ, APRN-FNP-BC,

## 2022-09-08 DIAGNOSIS — Z882 Allergy status to sulfonamides status: Secondary | ICD-10-CM

## 2022-09-08 DIAGNOSIS — Z9889 Other specified postprocedural states: Secondary | ICD-10-CM

## 2022-09-08 DIAGNOSIS — Z885 Allergy status to narcotic agent status: Secondary | ICD-10-CM

## 2022-09-08 LAB — BASIC METABOLIC PANEL
BUN/CREA RATIO: 14
BUN: 20 mg/dL (ref 8–23)
CALCIUM: 8.4 mg/dL (ref 8.3–10.7)
CHLORIDE: 110 mmol/L — ABNORMAL HIGH (ref 96–106)
CO2 TOTAL: 26 mmol/L (ref 22–30)
CREATININE: 1.41 mg/dL — ABNORMAL HIGH (ref 0.50–0.90)
ESTIMATED GFR: 41 mL/min/{1.73_m2} — ABNORMAL LOW (ref >90)
GLUCOSE: 123 mg/dL — ABNORMAL HIGH (ref 74–109)
POTASSIUM: 3.9 mmol/L (ref 3.2–5.0)
SODIUM: 143 mmol/L (ref 133–144)

## 2022-09-08 LAB — CBC
HCT: 35 % (ref 34.8–46.0)
HGB: 10.7 g/dL — ABNORMAL LOW (ref 11.5–16.0)
MCH: 27.4 pg (ref 26.0–32.0)
MCHC: 30.6 g/dL — ABNORMAL LOW (ref 31.0–35.5)
MCV: 89.7 fL (ref 78.0–100.0)
MPV: 11.1 fL (ref 8.7–12.5)
PLATELETS: 223 10*3/uL (ref 150–400)
RBC: 3.9 10*6/uL (ref 3.85–5.22)
RDW-CV: 14.7 % (ref 11.5–15.5)
WBC: 8.9 10*3/uL (ref 3.7–11.0)

## 2022-09-08 LAB — POC BLOOD GLUCOSE (RESULTS): GLUCOSE, POC: 122 mg/dl — ABNORMAL HIGH (ref 70–100)

## 2022-09-08 MED ORDER — OXYCODONE-ACETAMINOPHEN 5 MG-325 MG TABLET
1.0000 | ORAL_TABLET | ORAL | 0 refills | Status: AC | PRN
Start: 2022-09-08 — End: ?

## 2022-09-08 NOTE — Discharge Instructions (Addendum)
Any questions or concerns related to wound, please call Dr. Adele Barthel office.  Change dressing daily and as needed.  Clean and pat dry.  Place adaptic and secure with Island gauze dressing.  No heavy lifting or straining until cleared by Dr. Andrey Cota.

## 2022-09-08 NOTE — Care Plan (Signed)
Problem: Adult Inpatient Plan of Care  Goal: Plan of Care Review  Outcome: Ongoing (see interventions/notes)   [place] [facility] Plan Note    Situation: ileostomy reversal     Intervention: GI motility, pain control     Response: passing gas, did not ask for pain meds all night.       Orbie Hurst, RN

## 2022-09-08 NOTE — Discharge Summary (Signed)
[place] [facility]  DISCHARGE SUMMARY    PATIENT NAME:  Debra Lloyd, Debra Lloyd  MRN:  [id number]  DOB:  February 07, 1956    ENCOUNTER DATE:  09/06/2022  INPATIENT ADMISSION DATE: 09/06/2022  DISCHARGE DATE:  09/08/2022    ATTENDING PHYSICIAN: Jetty Peeks, MD  SERVICE: [facility] GENERAL SURGERY  PRIMARY CARE PHYSICIAN: Leandrew Koyanagi, MD       No lay caregiver identified.    PRIMARY DISCHARGE DIAGNOSIS: Status post reversal of ileostomy  Active Hospital Problems    Diagnosis Date Noted    Principal Problem: Status post reversal of ileostomy [Z98.890] 09/06/2022      Resolved Hospital Problems   No resolved problems to display.     There are no active non-hospital problems to display for this patient.          Current Discharge Medication List        START taking these medications.        Details   oxyCODONE-acetaminophen 5-325 mg Tablet  Commonly known as: PERCOCET   1 Tablet, Oral, EVERY 4 HOURS PRN  Qty: 25 Tablet  Refills: 0            CONTINUE these medications - NO CHANGES were made during your visit.        Details   atenoloL 50 mg Tablet  Commonly known as: TENORMIN   75 mg, Oral, 2 TIMES DAILY  Refills: 0     famotidine 40 mg Tablet  Commonly known as: PEPCID   40 mg, Oral, NIGHTLY PRN  Refills: 0     * HUMALOG U-100 INSULIN SUBQ   6 Units, Subcutaneous, 2 TIMES DAILY  Refills: 0     * HUMALOG U-100 INSULIN SUBQ   Subcutaneous, DAILY, 2pm: sliding scale  Refills: 0     HUMULIN N NPH U-100 INSULIN SUBQ   60 Units, Subcutaneous, EVERY MORNING, 40 units in evening  Refills: 0     LORazepam 1 mg Tablet  Commonly known as: ATIVAN   1 mg, Oral, NIGHTLY  Refills: 0     TYLENOL ARTHRITIS ORAL   1 Tablet, 3 TIMES DAILY PRN  Refills: 0           * This list has 2 medication(s) that are the same as other medications prescribed for you. Read the directions carefully, and ask your doctor or other care provider to review them with you.                STOP taking these medications.      ciprofloxacin HCl 500 mg  Tablet  Commonly known as: CIPRO            Discharge med list refreshed?  YES     Allergies   Allergen Reactions    Iodinated Contrast Media Anaphylaxis    Sulfa (Sulfonamides)  Other Adverse Reaction (Add comment)     blisters    Contrast Dye [Iv Contrast] Itching    Morphine Itching     HOSPITAL PROCEDURE(S):   No orders of the defined types were placed in this encounter.    Surgical/Procedural Cases on this Admission       Case IDs Date Procedure Surgeon Location Status    [id number] 09/06/22 CLOSURE COLOSTOMY Jetty Peeks, MD [facility] OR MAIN Comp          REASON FOR HOSPITALIZATION AND HOSPITAL COURSE   BRIEF HPI:  This is a 67 y.o., female status post Hartmann's reversal which at the  time of the procedure had a small leak at the colo rectal anastomosis for this reason a diverting ileostomy was performed.  She has recently underwent workup with evaluation of the anastomosis which is good and is being brought in for reversal of her loop ileostomy .     BRIEF HOSPITAL NARRATIVE:   Patient is postop day 2 after a loop ileostomy closure. Pain control. Tolerating regular diet, no nausea vomiting.  Right lower quadrant wound dressing changed without concerns.  She will be discharged today, follow up in 1 week with Dr. Andrey Cota    CONDITION ON DISCHARGE:  A. Ambulation: Full ambulation  B. Self-care Ability: Complete  C. Cognitive Status Oriented x 3  D. Code status at discharge:       LINES/DRAINS/WOUNDS AT DISCHARGE:   Patient Lines/Drains/Airways Status       Active Line / Dialysis Catheter / Dialysis Graft / Drain / Airway / Wound       Name Placement date Placement time Site Days    Peripheral IV Left Median Cubital  (antecubital fossa) 09/06/22  0805  -- 2    Wound  Incision Lower;Medial;Right Abdomen 09/06/22  --  -- 2                    DISCHARGE DISPOSITION:  Home discharge  DISCHARGE INSTRUCTIONS:  Post-Discharge Follow Up Appointments       Schedule an appointment with Jetty Peeks, MD as soon as possible  for a visit in 1 week(s)    Phone: [phone]    Where: [facility]          No discharge procedures on file.       Dian Situ, APRN-FNP-BC    Copies sent to Care Team         Relationship Specialty Notifications Start End    Pitsenbarger, Ivory Broad, MD PCP - General FAMILY MEDICINE  08/10/22     Phone: [phone] Fax: [phone]         [address] [place] [address]            Referring providers can utilize [email or web address] to access their referred [facility] Medicine patient's information.

## 2022-09-08 NOTE — Nurses Notes (Signed)
Discharge packet given to pt.IV Catheter removed and dressing applied.No bleeding or drainage.Taken the pt to [facility] by the Primary Nurse.Pt went home with Family.

## 2022-09-13 ENCOUNTER — Other Ambulatory Visit: Payer: Self-pay

## 2022-09-13 ENCOUNTER — Encounter (HOSPITAL_BASED_OUTPATIENT_CLINIC_OR_DEPARTMENT_OTHER): Payer: Self-pay | Admitting: Surgery

## 2022-09-13 ENCOUNTER — Ambulatory Visit: Payer: 59 | Attending: Surgery | Admitting: Surgery

## 2022-09-13 VITALS — BP 153/73 | HR 56 | Ht 66.0 in | Wt 199.8 lb

## 2022-09-13 DIAGNOSIS — Z5189 Encounter for other specified aftercare: Secondary | ICD-10-CM | POA: Insufficient documentation

## 2022-09-13 NOTE — Progress Notes (Signed)
GENERAL SURGERY, MEDICAL OFFICE [facility]ILDING [facility]  [address]  [place] [address]  Operated by [facility]    Office Note           Name: Debra Lloyd MRN:  [id number]   Date of Birth: 1956-01-25 Age: 67 y.o.   Date: 09/13/2022       Provider: Little Ishikawa, MD  PCP: Leandrew Koyanagi, MD  Referring Provider: Ivory Broad Pitsenbarger   This patient is accompanied in the office by daughter    Reason for visit: Post Op      HISTORY OF PRESENT ILLNESS:    Debra Lloyd is a 67 y.o. year old female who  underwent takedown of ileostomy about 1 week go.  Patient is doing well this time.  She is eating normally.  Initially she was having 10-12 bowel movements per day but that has now resolved and she reports 2 per day.    Past Medical History:  Allergies   Allergen Reactions    Iodinated Contrast Media Anaphylaxis    Sulfa (Sulfonamides)  Other Adverse Reaction (Add comment)     blisters    Contrast Dye [Iv Contrast] Itching    Morphine Itching       Past Surgical History:   Procedure Laterality Date    ANKLE SURGERY Right     no hardware present    CESAREAN SECTION      x 1    CLOSURE COLOSTOMY N/A 09/06/2022    Performed by Jetty Peeks, MD at [facility] OR MAIN    COLONOSCOPY      multiple    HX HYSTERECTOMY      ILEOSTOMY          Past Medical History:   Diagnosis Date    Anemia, unspecified     Chronic kidney disease     uncertain of stage    Chronic pain     Diverticulitis     Dysrhythmias     Esophageal reflux     Essential hypertension     Fatty liver     Fibromyalgia     Fistula of intestine     History of kidney disease     Hyperlipidemia     Neck problem     Neuropathy (CMS HCC)     feet    Palpitations     PONV (postoperative nausea and vomiting)     Presence of ileostomy (CMS HCC)     Spinal stenosis, multilevel     Thyroid disorder     hypothyroidism - no meds prescribed currently    Thyroid nodule     Type 2 diabetes mellitus (CMS HCC)        Family Medical History:    None          Social  History     Tobacco Use    Smoking status: Former     Types: Cigarettes    Smokeless tobacco: Never    Tobacco comments:     Former vaping x few months.   Substance Use Topics    Alcohol use: Not Currently    Drug use: Never       HOME MEDICATIONS:  Current Outpatient Medications   Medication Sig    acetaminophen (TYLENOL ARTHRITIS ORAL) Take 1 Tablet by mouth Three times a day as needed for Other (Patient not taking: Reported on 09/06/2022)    atenoloL (TENORMIN) 50 mg Oral Tablet Take 1.5 Tablets (75 mg total) by mouth  Twice daily    famotidine (PEPCID) 40 mg Oral Tablet Take 1 Tablet (40 mg total) by mouth Every night as needed for Other    insulin lispro (HUMALOG U-100 INSULIN SUBQ) Inject 6 Units under the skin Twice daily    insulin lispro (HUMALOG U-100 INSULIN SUBQ) Inject under the skin Once a day 2pm: sliding scale    insulin NPH human isophane (HUMULIN N NPH U-100 INSULIN SUBQ) Inject 60 Units under the skin Every morning 40 units in evening    LORazepam (ATIVAN) 1 mg Oral Tablet Take 1 Tablet (1 mg total) by mouth Every night    oxyCODONE-acetaminophen (PERCOCET) 5-325 mg Oral Tablet Take 1 Tablet by mouth Every 4 hours as needed        Review of Systems:  The pertinent ROS as addressed in the HPI.    Objective:  Physical Exam:   Vital Signs:  Heart Rate: 56  BP (Non-Invasive): (!) 153/73  SpO2: 99 %    Physical Exam:  GENERAL:  No acute distress  SKIN: Warm and dry.  HEAD: Atraumatic, normocephalic.  EYES: Pupils equal and reactive. EOMI.  CARDIAC: S1 and S2, no murmurs or gallops.   LUNGS: Clear to auscultation bilaterally. No wheezes or rhonchi. No accessory muscle use noted.  GI: Abdomen soft, nontender. Good bowel sounds x 4. No organomegaly appreciated.  Ostomy site with for mattress sutures in place.  One appears to be dehisced with proximally 3 mm dehiscence of the skin in the right central portion of the incision.  Clean healthy granulation tissue is present.  NEUROLOGICAL: No gross focal  deficit.  PSYCHIATRIC: Appropriate mood and affect.     No results found.    Recent Results (from the past 720 hour(s))   SURGICAL PATHOLOGY SPECIMEN   Result Value    Final Diagnosis      A.  Ileum:  Segment of ileum with focal surface erosion, chronic active inflammation, fibrosis in the background of hemorrhage.  Negative for malignancy.      Gross Description      A: Ileum  Part A is received in formalin, labeled "Debra Lloyd, ileostomy is a previously opened section of bowel/mucosa with multiple staple lines present.  The specimen measures 5.5 x 4.2 x 2.5 cm.  The mucosal surface is tan-red and folded with an opening present at 1 of the staple lines.  The opening measures 3.2 cm.  Representative sections are submitted as follows:    A1, sections at the opening on the mucosal surface  A2, mucosa, on either side of the embedded staple line  A3, normal appearing mucosa          Assessment:  No diagnosis found.     Plan:  No orders of the defined types were placed in this encounter.      Status post ileostomy takedown.  Overall doing well.  Minor skin dehiscence but well on its way to healing by secondary intention.  We will leave remaining sutures for another week and returned to the office for suture removal next week.    No follow-ups on file.      Little Ishikawa MD  09/13/22 11:20   Department of Surgery      Portions of this note may be dictated using voice recognition software or a dictation service. Variances in spelling and vocabulary are possible and unintentional. Not all errors are caught/corrected. Please notify the Thereasa Parkin if any discrepancies are noted or if the meaning of any statement  is not clear.

## 2022-09-20 ENCOUNTER — Ambulatory Visit: Payer: 59 | Attending: Surgery | Admitting: Surgery

## 2022-09-20 ENCOUNTER — Encounter (HOSPITAL_BASED_OUTPATIENT_CLINIC_OR_DEPARTMENT_OTHER): Payer: Self-pay | Admitting: Surgery

## 2022-09-20 ENCOUNTER — Other Ambulatory Visit: Payer: Self-pay

## 2022-09-20 VITALS — BP 156/81 | HR 63 | Temp 98.5°F | Ht 66.0 in | Wt 201.0 lb

## 2022-09-20 DIAGNOSIS — Z48815 Encounter for surgical aftercare following surgery on the digestive system: Secondary | ICD-10-CM | POA: Insufficient documentation

## 2022-09-20 DIAGNOSIS — Z5189 Encounter for other specified aftercare: Secondary | ICD-10-CM | POA: Insufficient documentation

## 2022-09-20 DIAGNOSIS — Z4802 Encounter for removal of sutures: Secondary | ICD-10-CM | POA: Insufficient documentation

## 2022-09-20 DIAGNOSIS — T8131XD Disruption of external operation (surgical) wound, not elsewhere classified, subsequent encounter: Secondary | ICD-10-CM

## 2022-09-20 DIAGNOSIS — Z9889 Other specified postprocedural states: Secondary | ICD-10-CM | POA: Insufficient documentation

## 2022-09-20 NOTE — Patient Instructions (Signed)
Patient may shower.  We will follow up in 6 weeks.

## 2022-09-20 NOTE — Progress Notes (Signed)
GENERAL SURGERY, MEDICAL OFFICE BUILDING [facility]OU[facility]  [address]  [place] [address]  Operated by [facility]    Office Note           Name: Debra Lloyd MRN:  [id number]   Date of Birth: [birth year] Age: 67 y.o.   Date: 09/20/2022       Provider: Little Ishikawa, MD  PCP: Leandrew Koyanagi, MD  Referring Provider: Ivory Broad Pitsenbarger   This patient is accompanied in the office by daughter    Reason for visit: No chief complaint on file.      HISTORY OF PRESENT ILLNESS:    Debra Lloyd is a 67 y.o. year old female who  underwent takedown of ileostomy approximately 3 weeks ago.  Patient returned last week with 4 sutures in place for closure of ileostomy and 1 had broken with partial dehiscence of the skin.  Additional 3 stitches were left in place and patient is now returning a week later for suture removal.  Very superficial dehiscence with a small amount of granulation tissue is beginning to scab over and reepithelialized.  Remaining 3 nylon vertical mattress sutures were removed with aseptic technique today and Steri-Strips and benzoin applied.    Past Medical History:  Allergies   Allergen Reactions    Iodinated Contrast Media Anaphylaxis    Sulfa (Sulfonamides)  Other Adverse Reaction (Add comment)     blisters    Contrast Dye [Iv Contrast] Itching    Morphine Itching       Past Surgical History:   Procedure Laterality Date    ANKLE SURGERY Right     no hardware present    CESAREAN SECTION      x 1    CLOSURE COLOSTOMY N/A 09/06/2022    Performed by Jetty Peeks, MD at [facility] OR MAIN    COLONOSCOPY      multiple    HX HYSTERECTOMY      ILEOSTOMY          Past Medical History:   Diagnosis Date    Anemia, unspecified     Chronic kidney disease     uncertain of stage    Chronic pain     Diverticulitis     Dysrhythmias     Esophageal reflux     Essential hypertension     Fatty liver     Fibromyalgia     Fistula of intestine     History of kidney disease     Hyperlipidemia     Neck problem      Neuropathy (CMS HCC)     feet    Palpitations     PONV (postoperative nausea and vomiting)     Presence of ileostomy (CMS HCC)     Spinal stenosis, multilevel     Thyroid disorder     hypothyroidism - no meds prescribed currently    Thyroid nodule     Type 2 diabetes mellitus (CMS HCC)        Family Medical History:    None          Social History     Tobacco Use    Smoking status: Former     Types: Cigarettes    Smokeless tobacco: Never    Tobacco comments:     Former vaping x few months.   Substance Use Topics    Alcohol use: Not Currently    Drug use: Never       HOME MEDICATIONS:  Current  Outpatient Medications   Medication Sig    acetaminophen (TYLENOL ARTHRITIS ORAL) Take 1 Tablet by mouth Three times a day as needed for Other (Patient not taking: Reported on 09/06/2022)    atenoloL (TENORMIN) 50 mg Oral Tablet Take 1.5 Tablets (75 mg total) by mouth Twice daily    famotidine (PEPCID) 40 mg Oral Tablet Take 1 Tablet (40 mg total) by mouth Every night as needed for Other    insulin lispro (HUMALOG U-100 INSULIN SUBQ) Inject 6 Units under the skin Twice daily    insulin lispro (HUMALOG U-100 INSULIN SUBQ) Inject under the skin Once a day 2pm: sliding scale    insulin NPH human isophane (HUMULIN N NPH U-100 INSULIN SUBQ) Inject 60 Units under the skin Every morning 40 units in evening    LORazepam (ATIVAN) 1 mg Oral Tablet Take 1 Tablet (1 mg total) by mouth Every night    oxyCODONE-acetaminophen (PERCOCET) 5-325 mg Oral Tablet Take 1 Tablet by mouth Every 4 hours as needed        Review of Systems:  The pertinent ROS as addressed in the HPI.    Objective:  Physical Exam:   Vital Signs:  Temperature: 36.9 C (98.5 F)  Heart Rate: 63  BP (Non-Invasive): (!) 156/81    Physical Exam:  Physical Exam  Abdominal:          Comments: Lactose midline is incision.  At [place] is small area of skin dehiscence that is beginning to scab over.         No results found.    Recent Results (from the past 720 hour(s))   SURGICAL  PATHOLOGY SPECIMEN   Result Value    Final Diagnosis      A.  Ileum:  Segment of ileum with focal surface erosion, chronic active inflammation, fibrosis in the background of hemorrhage.  Negative for malignancy.      Gross Description      A: Ileum  Part A is received in formalin, labeled "Monsour, Caia, ileostomy is a previously opened section of bowel/mucosa with multiple staple lines present.  The specimen measures 5.5 x 4.2 x 2.5 cm.  The mucosal surface is tan-red and folded with an opening present at 1 of the staple lines.  The opening measures 3.2 cm.  Representative sections are submitted as follows:    A1, sections at the opening on the mucosal surface  A2, mucosa, on either side of the embedded staple line  A3, normal appearing mucosa          Assessment:    ICD-10-CM    1. Visit for wound check  Z51.89       2. S/P closure of ileostomy  Z98.890            Plan:  No orders of the defined types were placed in this encounter.    We will recheck proximally 6 weeks.  May shower.  Call or ER problems.    No follow-ups on file.      Little Ishikawa MD  09/20/22 14:48   Department of Surgery      Portions of this note may be dictated using voice recognition software or a dictation service. Variances in spelling and vocabulary are possible and unintentional. Not all errors are caught/corrected. Please notify the Thereasa Parkin if any discrepancies are noted or if the meaning of any statement is not clear.

## 2022-09-28 ENCOUNTER — Ambulatory Visit (HOSPITAL_BASED_OUTPATIENT_CLINIC_OR_DEPARTMENT_OTHER): Payer: Self-pay | Admitting: Surgical Critical Care

## 2022-11-02 ENCOUNTER — Encounter (HOSPITAL_BASED_OUTPATIENT_CLINIC_OR_DEPARTMENT_OTHER): Payer: Self-pay | Admitting: Surgical Critical Care

## 2022-11-02 ENCOUNTER — Ambulatory Visit: Payer: 59 | Attending: Surgical Critical Care | Admitting: Surgical Critical Care

## 2022-11-02 ENCOUNTER — Other Ambulatory Visit: Payer: Self-pay

## 2022-11-02 VITALS — BP 151/65 | HR 69 | Temp 98.3°F | Resp 16 | Ht 66.0 in | Wt 204.8 lb

## 2022-11-02 DIAGNOSIS — Z4889 Encounter for other specified surgical aftercare: Secondary | ICD-10-CM

## 2022-11-02 DIAGNOSIS — Z9889 Other specified postprocedural states: Secondary | ICD-10-CM | POA: Insufficient documentation

## 2022-11-02 NOTE — Progress Notes (Signed)
GENERAL SURGERY, [facility]  [address]  [place] [address]  Operated by [facility]     Name: Debra Lloyd MRN:  [id number]   Date of Birth: 11/01/1955 Age: 67 y.o.   Date: 11/02/2022       Provider: Jetty Peeks, MD  PCP: Leandrew Koyanagi, MD  Referring Provider: Ivory Broad Pitsenbarger     Reason for visit: Post Op      Objective:  Debra Lloyd is a 67 y.o. female presenting with she has had no fevers her significant abdominal pain her chief complaint is is that she does have constipation.  There has been no nausea or vomiting fevers or chills no evidence of recurrent diverticulitis.    Past Medical History:  Past Medical History:   Diagnosis Date    Anemia, unspecified     Chronic kidney disease     uncertain of stage    Chronic pain     Diverticulitis     Dysrhythmias     Esophageal reflux     Essential hypertension     Fatty liver     Fibromyalgia     Fistula of intestine     History of kidney disease     Hyperlipidemia     Neck problem     Neuropathy (CMS HCC)     feet    Palpitations     PONV (postoperative nausea and vomiting)     Presence of ileostomy (CMS HCC)     Spinal stenosis, multilevel     Thyroid disorder     hypothyroidism - no meds prescribed currently    Thyroid nodule     Type 2 diabetes mellitus (CMS HCC)      Past Surgical History:   Procedure Laterality Date    ANKLE SURGERY Right     no hardware present    CESAREAN SECTION      x 1    COLONOSCOPY      multiple    HX HYSTERECTOMY      ILEOSTOMY        Allergies   Allergen Reactions    Iodinated Contrast Media Anaphylaxis    Sulfa (Sulfonamides)  Other Adverse Reaction (Add comment)     blisters    Contrast Dye [Iv Contrast] Itching    Morphine Itching     Current Outpatient Medications   Medication Sig    acetaminophen (TYLENOL ARTHRITIS ORAL) Take 1 Tablet by mouth Three times a day as needed for Other (Patient not taking: Reported on 09/06/2022)    atenoloL (TENORMIN) 50 mg Oral Tablet Take  1.5 Tablets (75 mg total) by mouth Twice daily    famotidine (PEPCID) 40 mg Oral Tablet Take 1 Tablet (40 mg total) by mouth Every night as needed for Other    insulin lispro (HUMALOG U-100 INSULIN SUBQ) Inject 6 Units under the skin Twice daily    insulin lispro (HUMALOG U-100 INSULIN SUBQ) Inject under the skin Once a day 2pm: sliding scale    insulin NPH human isophane (HUMULIN N NPH U-100 INSULIN SUBQ) Inject 60 Units under the skin Every morning 40 units in evening    LORazepam (ATIVAN) 1 mg Oral Tablet Take 1 Tablet (1 mg total) by mouth Every night    oxyCODONE-acetaminophen (PERCOCET) 5-325 mg Oral Tablet Take 1 Tablet by mouth Every 4 hours as needed     Family Medical History:    None        Social  History     Socioeconomic History    Marital status: Single   Tobacco Use    Smoking status: Former     Types: Cigarettes    Smokeless tobacco: Never    Tobacco comments:     Former vaping x few months.   Substance and Sexual Activity    Alcohol use: Not Currently    Drug use: Never   Other Topics Concern    Ability to Walk 1 Flight of Steps without SOB/CP Yes    Routine Exercise No    Ability to Walk 2 Flight of Steps without SOB/CP Yes    Unable to Ambulate No    Total Care No    Ability To Do Own ADL's Yes    Uses Walker No    Uses Cane No     Social Determinants of Health     Social Connections: Low Risk  (09/06/2022)    Social Connections     SDOH Social Isolation: 5 or more times a week       Review of Systems:  The pertinent ROS as addressed in the HPI.    Objective:  Physical Exam:   Vital Signs:  Vitals:    11/02/22 1524   BP: (!) 151/65   Pulse: 69   Resp: 16   Temp: 36.8 C (98.3 F)   TempSrc: Oral   SpO2: 100%   Weight: 92.9 kg (204 lb 12.8 oz)   Height: 1.676 m (5\' 6" )   BMI: 33.12        Physical Exam:  GENERAL:  The patient is alert orient x3 in no apparent distress he is able to get up on the exam   GI: Abdomen soft, nontender.  There are multiple scars are all surgical healing well without any  major complications she has no tenderness no evidence of hernia at the present time Good bowel sounds x 4. No organomegaly appreciated.  GU: No suprapubic tenderness. No costovertebral tenderness.  MUSCULOSKELETAL: Good muscle strength throughout.  EXTREMITIES: No pedal edema noted. Distal pulses equal bilaterally.  NEUROLOGICAL: Cranial nerves grossly intact. No gross focal deficit.  PSYCHIATRIC: Appropriate mood and affect.     Assessment:    ICD-10-CM    1. Status post reversal of ileostomy  Z98.890            Plan:  I have talked to her at length about using laxatives stool softeners to help with her regular bowel movements        Plan to follow her up in 6 months, may need a colonoscopy and/or BE to reexamine the anastomosis  Jetty Peeks, MD     Portions of this note may be dictated using voice recognition software or a dictation service. Variances in spelling and vocabulary are possible and unintentional. Not all errors are caught/corrected. Please notify the Thereasa Parkin if any discrepancies are noted or if the meaning of any statement is not clear.

## 2023-02-02 ENCOUNTER — Ambulatory Visit (HOSPITAL_COMMUNITY): Payer: Self-pay | Admitting: Student in an Organized Health Care Education/Training Program

## 2023-02-21 ENCOUNTER — Telehealth (HOSPITAL_COMMUNITY): Payer: Self-pay | Admitting: Student in an Organized Health Care Education/Training Program

## 2023-02-21 NOTE — Telephone Encounter (Signed)
Left voice mail for patient to bring disk of MRI if possible to appt

## 2023-02-26 ENCOUNTER — Ambulatory Visit
Payer: No Typology Code available for payment source | Attending: Student in an Organized Health Care Education/Training Program | Admitting: Student in an Organized Health Care Education/Training Program

## 2023-02-26 ENCOUNTER — Other Ambulatory Visit: Payer: Self-pay

## 2023-02-26 VITALS — Ht 66.0 in | Wt 204.0 lb

## 2023-02-26 DIAGNOSIS — M75112 Incomplete rotator cuff tear or rupture of left shoulder, not specified as traumatic: Secondary | ICD-10-CM | POA: Insufficient documentation

## 2023-02-26 DIAGNOSIS — M75102 Unspecified rotator cuff tear or rupture of left shoulder, not specified as traumatic: Secondary | ICD-10-CM | POA: Insufficient documentation

## 2023-02-26 MED ORDER — LIDOCAINE (PF) 10 MG/ML (1 %) INJECTION SOLUTION
4.0000 mL | Freq: Once | INTRAMUSCULAR | Status: AC
Start: 2023-02-26 — End: 2023-02-26
  Administered 2023-02-26: 4 mL via INTRAMUSCULAR

## 2023-02-26 MED ORDER — TRIAMCINOLONE ACETONIDE 40 MG/ML SUSPENSION FOR INJECTION
80.0000 mg | INTRAMUSCULAR | Status: AC
Start: 2023-02-26 — End: 2023-02-26
  Administered 2023-02-26: 80 mg via INTRA_ARTICULAR
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
## 2023-02-27 NOTE — Procedures (Signed)
ORTHOPEDICS, [facility]  [address]  [place] [address]  Operated by [facility]  Procedure Note    Name: Debra Lloyd MRN:  [id number]   Date: 02/26/2023 DOB:  05-08-1955 (67 y.o.)         Large Joint Arthrocentesis: L subacromial bursa  Indications: pain  Details: 21 G needle, posterior approach  Outcome: tolerated well, no immediate complications  Procedure, treatment alternatives, risks and benefits explained, specific risks discussed. Consent was given by the patient. Patient was prepped and draped in the usual sterile fashion.         Procedure Note Cortisone Injection to Shoulder  The Left shoulder was identified as the injection site. The risk and benefits of a cortisone injection were explained and the patient consented to the injection. Under sterile conditions, the left  shoulder posteriorly into the subacromial space was injected with a mixture of 80mg  of Kenelog and 4 mL of 1% Lidocaine without complication. A sterile bandage was applied.    Howie Ill, DO

## 2023-04-30 ENCOUNTER — Ambulatory Visit (HOSPITAL_COMMUNITY): Payer: Self-pay | Admitting: Student in an Organized Health Care Education/Training Program

## 2023-05-03 ENCOUNTER — Ambulatory Visit (HOSPITAL_BASED_OUTPATIENT_CLINIC_OR_DEPARTMENT_OTHER): Payer: Self-pay | Admitting: Surgical Critical Care

## 2023-05-28 ENCOUNTER — Other Ambulatory Visit: Payer: Self-pay

## 2023-05-28 ENCOUNTER — Ambulatory Visit
Payer: Self-pay | Attending: Student in an Organized Health Care Education/Training Program | Admitting: Student in an Organized Health Care Education/Training Program

## 2023-05-28 DIAGNOSIS — S46012D Strain of muscle(s) and tendon(s) of the rotator cuff of left shoulder, subsequent encounter: Secondary | ICD-10-CM | POA: Insufficient documentation

## 2023-05-28 DIAGNOSIS — M75102 Unspecified rotator cuff tear or rupture of left shoulder, not specified as traumatic: Secondary | ICD-10-CM | POA: Insufficient documentation

## 2023-05-28 MED ORDER — TRIAMCINOLONE ACETONIDE 40 MG/ML SUSPENSION FOR INJECTION
80.0000 mg | INTRAMUSCULAR | Status: AC
Start: 2023-05-28 — End: 2023-05-28
  Administered 2023-05-28: 80 mg via INTRA_ARTICULAR

## 2023-05-28 MED ORDER — LIDOCAINE (PF) 10 MG/ML (1 %) INJECTION SOLUTION
4.0000 mL | Freq: Once | INTRAMUSCULAR | Status: AC
Start: 2023-05-28 — End: 2023-05-28
  Administered 2023-05-28: 4 mL via INTRAMUSCULAR

## 2023-05-28 NOTE — Procedures (Signed)
 ORTH[facility]EDICS, [facility]  [address]   [place] [address]  Operated by [facility]  Procedure Note    Name: Debra Lloyd MRN:  [id number]   Date: 05/28/2023 DOB:  1955/11/08 (67 y.o.)         Large Joint Arthrocentesis: L subacromial bursa  Indications: pain  Details: 21 G needle, posterior approach  Outcome: tolerated well, no immediate complications  Procedure, treatment alternatives, risks and benefits explained, specific risks discussed. Consent was given by the patient. Patient was prepped and draped in the usual sterile fashion.         Procedure Note Cortisone Injection to Shoulder  The Left shoulder was identified as the injection site. The risk and benefits of a cortisone injection were explained and the patient consented to the injection. Under sterile conditions, the left  shoulder posteriorly into the subacromial space was injected with a mixture of 80mg  of Kenelog and 4 mL of 1% Lidocaine without complication. A sterile bandage was applied.    Howie Ill, DO
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
## 2023-06-07 ENCOUNTER — Ambulatory Visit (HOSPITAL_BASED_OUTPATIENT_CLINIC_OR_DEPARTMENT_OTHER): Payer: Self-pay | Admitting: Surgical Critical Care

## 2023-06-28 ENCOUNTER — Encounter (HOSPITAL_BASED_OUTPATIENT_CLINIC_OR_DEPARTMENT_OTHER): Payer: Self-pay | Admitting: Surgical Critical Care

## 2023-06-28 ENCOUNTER — Ambulatory Visit: Payer: Self-pay | Attending: Surgical Critical Care | Admitting: Surgical Critical Care

## 2023-06-28 ENCOUNTER — Other Ambulatory Visit: Payer: Self-pay

## 2023-06-28 VITALS — BP 169/80 | HR 94 | Temp 98.5°F | Ht 66.0 in | Wt 204.2 lb

## 2023-06-28 DIAGNOSIS — Z4889 Encounter for other specified surgical aftercare: Secondary | ICD-10-CM

## 2023-06-28 DIAGNOSIS — Z9889 Other specified postprocedural states: Secondary | ICD-10-CM | POA: Insufficient documentation

## 2023-06-28 NOTE — Progress Notes (Signed)
 GENERAL SURGERY, [facility]  [address]  [place] [address]  Operated by [facility]     Name: Debra Lloyd MRN:  [id number]   Date of Birth: 16-Sep-1955 Age: 68 y.o.   Date: 06/28/2023       Provider: Jetty Peeks, MD  PCP: Waldron Labs, DO  Referring Provider: No ref. provider found     Reason for visit: Colostomy Reversal (The patient is status post colostomy reversal with the closure of a loop ileostomy)      Objective:  Debra Lloyd is a 68 y.o. female presenting with she is status post reversal of loop ileostomy that was done after a Hartmann's resection with primary anastomosis and a leak at the staple line.  She is doing well at this point she has noticed she has had intermittent problems with constipation that has caused some lower abdominal pain.  This has been improved with Linzess which she has used instead of the MiraLax at this point.  She denies having any melena or hematochezia or nausea or vomiting.  She is back to work her incisions are all healed well and there was no evidence of hernia.  She is followed by Dr. Alyson Locket for her GI stuff and I have recommended a colonoscopy in 3-4 years.    Past Medical History:  Past Medical History:   Diagnosis Date    Anemia, unspecified     Chronic kidney disease     uncertain of stage    Chronic pain     Diverticulitis     Dysrhythmias     Esophageal reflux     Essential hypertension     Fatty liver     Fibromyalgia     Fistula of intestine     History of kidney disease     Hyperlipidemia     Neck problem     Neuropathy (CMS HCC)     feet    Palpitations     PONV (postoperative nausea and vomiting)     Presence of ileostomy (CMS HCC)     Spinal stenosis, multilevel     Thyroid disorder     hypothyroidism - no meds prescribed currently    Thyroid nodule     Type 2 diabetes mellitus      Past Surgical History:   Procedure Laterality Date    ANKLE SURGERY Right     no hardware present    CESAREAN SECTION      x 1     COLONOSCOPY      multiple    HX HYSTERECTOMY      ILEOSTOMY        Allergies   Allergen Reactions    Iodinated Contrast Media Anaphylaxis    Sulfa (Sulfonamides)  Other Adverse Reaction (Add comment)     blisters    Contrast Dye [Iv Contrast] Itching    Morphine Itching     Current Outpatient Medications   Medication Sig    acetaminophen (TYLENOL ARTHRITIS ORAL) Take 1 Tablet by mouth Three times a day as needed for Other (Patient not taking: Reported on 09/06/2022)    atenoloL (TENORMIN) 50 mg Oral Tablet Take 1.5 Tablets (75 mg total) by mouth Twice daily    famotidine (PEPCID) 40 mg Oral Tablet Take 1 Tablet (40 mg total) by mouth Every night as needed for Other    insulin lispro (HUMALOG U-100 INSULIN SUBQ) Inject 6 Units under the skin Twice daily    insulin lispro (  HUMALOG U-100 INSULIN SUBQ) Inject under the skin Once a day 2pm: sliding scale    insulin NPH human isophane (HUMULIN N NPH U-100 INSULIN SUBQ) Inject 60 Units under the skin Every morning 40 units in evening    LORazepam (ATIVAN) 1 mg Oral Tablet Take 1 Tablet (1 mg total) by mouth Every night    oxyCODONE-acetaminophen (PERCOCET) 5-325 mg Oral Tablet Take 1 Tablet by mouth Every 4 hours as needed     Family Medical History:    None        Social History     Socioeconomic History    Marital status: Single   Tobacco Use    Smoking status: Former     Types: Cigarettes    Smokeless tobacco: Never    Tobacco comments:     Former vaping x few months.   Substance and Sexual Activity    Alcohol use: Not Currently    Drug use: Never   Other Topics Concern    Ability to Walk 1 Flight of Steps without SOB/CP Yes    Routine Exercise No    Ability to Walk 2 Flight of Steps without SOB/CP Yes    Unable to Ambulate No    Total Care No    Ability To Do Own ADL's Yes    Uses Walker No    Uses Cane No     Social Determinants of Health     Social Connections: Low Risk  (09/06/2022)    Social Connections     SDOH Social Isolation: 5 or more times a week       Review  of Systems:  The pertinent ROS as addressed in the HPI.    Objective:  Physical Exam:   Vital Signs:  Vitals:    06/28/23 1423   BP: (!) 169/80   Pulse: 94   Temp: 36.9 C (98.5 F)   TempSrc: Oral   SpO2: 98%   Weight: 92.6 kg (204 lb 3.2 oz)   Height: 1.676 m (5\' 6" )   BMI: 32.96        Physical Exam:  GENERAL:  Alert orient x3 in no apparent distress he is able to get up on the examination table without difficulties  SKIN: Warm and dry.  GI: Abdomen soft, nontender.  Midline scar is healed it is nontender in his no hernia the ostomy site in the right lower quadrant is also healed in his not tender there is no peritoneal signs and no masses Good bowel sounds x 4. No organomegaly appreciated.  GU: No suprapubic tenderness. No costovertebral tenderness.  MUSCULOSKELETAL: Good muscle strength throughout.  EXTREMITIES: No pedal edema noted. Distal pulses equal bilaterally.  NEUROLOGICAL: Cranial nerves grossly intact. No gross focal deficit.  PSYCHIATRIC: Appropriate mood and affect.     Assessment:    ICD-10-CM    1. History of colostomy reversal  Z98.890            Plan:      Status post ileostomy loop reversal doing well at this point intermittent problems with constipation have given her additional recommendations for a bowel regimen      Follow up in 6 months      Debra Helena, MD     Portions of this note may be dictated using voice recognition software or a dictation service. Variances in spelling and vocabulary are possible and unintentional. Not all errors are caught/corrected. Please notify the [facility] if any discrepancies are noted or if the meaning  of any statement is not clear.

## 2023-08-27 ENCOUNTER — Other Ambulatory Visit: Payer: Self-pay

## 2023-08-27 ENCOUNTER — Ambulatory Visit
Payer: Self-pay | Attending: Student in an Organized Health Care Education/Training Program | Admitting: Student in an Organized Health Care Education/Training Program

## 2023-08-27 DIAGNOSIS — M25512 Pain in left shoulder: Secondary | ICD-10-CM | POA: Insufficient documentation

## 2023-08-27 DIAGNOSIS — M75102 Unspecified rotator cuff tear or rupture of left shoulder, not specified as traumatic: Secondary | ICD-10-CM | POA: Insufficient documentation

## 2023-08-27 MED ORDER — TRIAMCINOLONE ACETONIDE 40 MG/ML SUSPENSION FOR INJECTION
80.0000 mg | INTRAMUSCULAR | Status: AC
Start: 2023-08-27 — End: 2023-08-27
  Administered 2023-08-27: 80 mg via INTRA_ARTICULAR

## 2023-08-27 MED ORDER — LIDOCAINE (PF) 10 MG/ML (1 %) INJECTION SOLUTION
4.0000 mL | Freq: Once | INTRAMUSCULAR | Status: AC
Start: 2023-08-27 — End: 2023-08-27
  Administered 2023-08-27: 4 mL via INTRAMUSCULAR
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
## 2023-08-27 NOTE — Procedures (Signed)
 ORTHOPEDICS, [facility]  [address]  [place] [address]  Operated by [facility]  Procedure Note    Name: Nitza Schmid MRN:  [id number]   Date: 08/27/2023 DOB:  1955-03-15 (68 y.o.)         Large Joint Arthrocentesis: L subacromial bursa  Indications: pain  Details: 21 G needle, posterior approach  Outcome: tolerated well, no immediate complications  Procedure, treatment alternatives, risks and benefits explained, specific risks discussed. Consent was given by the patient. Patient was prepped and draped in the usual sterile fashion.       Procedure Note Cortisone Injection to Shoulder  The Left shoulder was identified as the injection site. The risk and benefits of a cortisone injection were explained and the patient consented to the injection. Under sterile conditions, the left  shoulder posteriorly into the subacromial space was injected with a mixture of 80mg  of Kenelog and 4 mL of 1% Lidocaine  without complication. A sterile bandage was applied.    Clemente Cushing, DO

## 2023-12-03 ENCOUNTER — Ambulatory Visit (HOSPITAL_COMMUNITY): Payer: Self-pay | Admitting: Student in an Organized Health Care Education/Training Program

## 2023-12-18 ENCOUNTER — Other Ambulatory Visit: Payer: Self-pay

## 2023-12-18 ENCOUNTER — Ambulatory Visit
Payer: Self-pay | Attending: Student in an Organized Health Care Education/Training Program | Admitting: Student in an Organized Health Care Education/Training Program

## 2023-12-18 VITALS — Ht 66.0 in | Wt 204.0 lb

## 2023-12-18 DIAGNOSIS — M75112 Incomplete rotator cuff tear or rupture of left shoulder, not specified as traumatic: Secondary | ICD-10-CM | POA: Insufficient documentation

## 2023-12-18 DIAGNOSIS — M75102 Unspecified rotator cuff tear or rupture of left shoulder, not specified as traumatic: Secondary | ICD-10-CM | POA: Insufficient documentation

## 2023-12-18 MED ORDER — LIDOCAINE (PF) 10 MG/ML (1 %) INJECTION SOLUTION
4.0000 mL | Freq: Once | INTRAMUSCULAR | Status: AC
Start: 2023-12-18 — End: 2023-12-18
  Administered 2023-12-18: 4 mL via INTRAMUSCULAR

## 2023-12-18 MED ORDER — TRIAMCINOLONE ACETONIDE 40 MG/ML SUSPENSION FOR INJECTION
80.0000 mg | INTRAMUSCULAR | Status: AC
Start: 2023-12-18 — End: 2023-12-18
  Administered 2023-12-18: 80 mg via INTRA_ARTICULAR
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
## 2023-12-18 NOTE — Procedures (Signed)
 ORTHOPEDICS, [facility]  [address]  [place] [address]  Operated by [facility]  Procedure Note    Name: Debra Lloyd MRN:  [id number]   Date: 12/18/2023 DOB:  November 15, 1955 (68 y.o.)         Large Joint Arthrocentesis: L subacromial bursa  Indications: pain  Details: 21 G needle, posterior approach  Outcome: tolerated well, no immediate complications  Procedure, treatment alternatives, risks and benefits explained, specific risks discussed. Consent was given by the patient. Patient was prepped and draped in the usual sterile fashion.         Procedure Note Cortisone Injection to Shoulder  The Left shoulder was identified as the injection site. The risk and benefits of a cortisone injection were explained and the patient consented to the injection. Under sterile conditions, the left  shoulder into subacromial space was injected with a mixture of 80mg  of Kenelog and 4 mL of 1% Lidocaine  without complication. A sterile bandage was applied.   Bernardino Cunning, DO

## 2023-12-27 ENCOUNTER — Ambulatory Visit (HOSPITAL_BASED_OUTPATIENT_CLINIC_OR_DEPARTMENT_OTHER): Payer: Self-pay | Admitting: Surgical Critical Care

## 2024-01-03 ENCOUNTER — Ambulatory Visit (HOSPITAL_BASED_OUTPATIENT_CLINIC_OR_DEPARTMENT_OTHER): Payer: Self-pay | Admitting: Surgical Critical Care

## 2024-01-10 ENCOUNTER — Ambulatory Visit (HOSPITAL_BASED_OUTPATIENT_CLINIC_OR_DEPARTMENT_OTHER): Payer: Self-pay | Admitting: Surgical Critical Care

## 2024-01-17 ENCOUNTER — Ambulatory Visit (HOSPITAL_BASED_OUTPATIENT_CLINIC_OR_DEPARTMENT_OTHER): Payer: Self-pay | Admitting: Surgical Critical Care

## 2024-01-31 ENCOUNTER — Ambulatory Visit (HOSPITAL_BASED_OUTPATIENT_CLINIC_OR_DEPARTMENT_OTHER): Payer: Self-pay | Admitting: Surgical Critical Care

## 2024-02-04 ENCOUNTER — Other Ambulatory Visit: Payer: Self-pay

## 2024-02-04 ENCOUNTER — Ambulatory Visit: Payer: Self-pay | Admitting: Surgical Critical Care

## 2024-02-04 ENCOUNTER — Encounter (HOSPITAL_BASED_OUTPATIENT_CLINIC_OR_DEPARTMENT_OTHER): Payer: Self-pay | Admitting: Surgical Critical Care

## 2024-02-04 VITALS — BP 145/82 | HR 79 | Temp 98.7°F | Ht 66.0 in | Wt 193.2 lb

## 2024-02-04 DIAGNOSIS — Z9889 Other specified postprocedural states: Secondary | ICD-10-CM | POA: Insufficient documentation

## 2024-02-04 DIAGNOSIS — Z09 Encounter for follow-up examination after completed treatment for conditions other than malignant neoplasm: Secondary | ICD-10-CM

## 2024-03-03 NOTE — Progress Notes (Signed)
 TRAUMA SURGERY, [facility] BUILDING  [address]  [place] [address]  Operated by [facility]     Name: Debra Lloyd MRN:  [id number]   Date of Birth: 03/15/1955 Age: 68 y.o.   Date: 02/04/2024       Chief Complaint: 6 month follow-up    Subjective: Debra Lloyd is a 68 y.o. female who is status post reversal of loop ileostomy that was done after a Hartmann's resection with primary anastomosis and a leak at the staple line. She states her constipation is gradually improving over time. She denies any acute GI issues and states she follows regularly with Dr. Stana. She denies any acute issues.    Past Medical History:  Past Medical History:   Diagnosis Date    Anemia, unspecified     Chronic kidney disease     uncertain of stage    Chronic pain     Diverticulitis     Dysrhythmias     Esophageal reflux     Essential hypertension     Fatty liver     Fibromyalgia     Fistula of intestine     History of kidney disease     Hyperlipidemia     Neck problem     Neuropathy (CMS HCC)     feet    Palpitations     PONV (postoperative nausea and vomiting)     Presence of ileostomy (CMS HCC)     Spinal stenosis, multilevel     Thyroid disorder     hypothyroidism - no meds prescribed currently    Thyroid nodule     Type 2 diabetes mellitus      Past Surgical History:   Procedure Laterality Date    ANKLE SURGERY Right     no hardware present    CESAREAN SECTION      x 1    COLONOSCOPY      multiple    HX HYSTERECTOMY      ILEOSTOMY        Allergies[1]  Current Outpatient Medications   Medication Sig    acetaminophen  (TYLENOL  ARTHRITIS ORAL) Take 1 Tablet by mouth Three times a day as needed for Other    atenoloL (TENORMIN) 50 mg Oral Tablet Take 1.5 Tablets (75 mg total) by mouth Twice daily    famotidine  (PEPCID ) 40 mg Oral Tablet Take 1 Tablet (40 mg total) by mouth Every night as needed for Other    insulin  lispro (HUMALOG  U-100 INSULIN  SUBQ) Inject 6 Units under the skin Twice daily    insulin  lispro  (HUMALOG  U-100 INSULIN  SUBQ) Inject under the skin Once a day 2pm: sliding scale    insulin  NPH human isophane (HUMULIN N NPH U-100 INSULIN  SUBQ) Inject 60 Units under the skin Every morning 40 units in evening    LORazepam  (ATIVAN ) 1 mg Oral Tablet Take 1 Tablet (1 mg total) by mouth Every night    oxyCODONE -acetaminophen  (PERCOCET) 5-325 mg Oral Tablet Take 1 Tablet by mouth Every 4 hours as needed     Family Medical History:    None        Social History     Socioeconomic History    Marital status: Single   Tobacco Use    Smoking status: Former     Types: Cigarettes    Smokeless tobacco: Never    Tobacco comments:     Former vaping x few months.   Substance and Sexual Activity    Alcohol use: Not  Currently    Drug use: Never   Other Topics Concern    Ability to Walk 1 Flight of Steps without SOB/CP Yes    Routine Exercise No    Ability to Walk 2 Flight of Steps without SOB/CP Yes    Unable to Ambulate No    Total Care No    Ability To Do Own ADL's Yes    Uses Walker No    Uses Cane No     Social Determinants of Health     Social Connections: Low Risk (09/06/2022)    Social Connections     SDOH Social Isolation: 5 or more times a week       Review of Systems:  The pertinent ROS as addressed in the HPI.    Objective:  Vital Signs:  Vitals:    02/04/24 1244   BP: (!) 145/82   Pulse: 79   Temp: 37.1 C (98.7 F)   SpO2: 98%   Weight: 87.6 kg (193 lb 3.2 oz)   Height: 1.676 m (5' 6)   BMI: 31.18        Physical Exam:  GENERAL: Well-appearing in no acute distress.  SKIN: Warm, dry, and intact without visualized lesions.  HEENT: Atraumatic, normocephalic, EOMI. No scleral icterus.  NECK: Supple, trachea midline. No lymphadenopathy.  CARDIAC: Regular rate and rhythm. No murmur.   LUNGS: Clear to auscultation bilaterally. No crackles, wheezes, or rhonchi.  GI: Abdomen soft and non-tender in all four quadrants. No rebound or guarding. Incisions well-healed, formed RLQ ostomy site well healed, no palpable hernias,    NEUROLOGICAL: Alert and oriented to person, place, and time. Sensation equal and intact in BUE and BLE.  PSYCHIATRIC: Appropriate mood and affect.     Laboratory Results:    I have reviewed all pertinent laboratory results.    Radiology Results:    I have reviewed all pertinent radiology results.    Pathology Results:    I have reviewed all pertinent pathology results.    Assessment/Plan:  Assessment & Plan  History of reversal of ileostomy          - patient doing well since last seen, no acute issues, no evidence of hernia on exam  - patient follows with GI for regular colonoscopies  - patient may follow-up as needed    ENCOUNTER DIAGNOSES     ICD-10-CM   1. History of reversal of ileostomy  Z98.890      Thedora Pouch, MD         [1]   Allergies  Allergen Reactions    Iodinated Contrast Media Anaphylaxis    Sulfa (Sulfonamides)  Other Adverse Reaction (Add comment)     blisters    Contrast Dye [Iv Contrast] Itching    Morphine Itching

## 2024-03-25 ENCOUNTER — Ambulatory Visit
Payer: Self-pay | Attending: Student in an Organized Health Care Education/Training Program | Admitting: Student in an Organized Health Care Education/Training Program

## 2024-03-25 ENCOUNTER — Encounter (HOSPITAL_COMMUNITY): Payer: Self-pay | Admitting: Student in an Organized Health Care Education/Training Program

## 2024-03-25 ENCOUNTER — Other Ambulatory Visit: Payer: Self-pay

## 2024-03-25 VITALS — Ht 66.0 in | Wt 193.0 lb

## 2024-03-25 DIAGNOSIS — M75102 Unspecified rotator cuff tear or rupture of left shoulder, not specified as traumatic: Secondary | ICD-10-CM

## 2024-03-25 DIAGNOSIS — M75112 Incomplete rotator cuff tear or rupture of left shoulder, not specified as traumatic: Secondary | ICD-10-CM | POA: Insufficient documentation

## 2024-03-25 MED ORDER — LIDOCAINE (PF) 10 MG/ML (1 %) INJECTION SOLUTION
4.0000 mL | Freq: Once | INTRAMUSCULAR | Status: AC
  Administered 2024-03-25: 4 mL via INTRAMUSCULAR

## 2024-03-25 MED ORDER — TRIAMCINOLONE ACETONIDE 40 MG/ML SUSPENSION FOR INJECTION
80.0000 mg | INTRAMUSCULAR | Status: AC
  Administered 2024-03-25: 80 mg via INTRA_ARTICULAR
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
## 2024-03-25 NOTE — Addendum Note (Signed)
 Addended by: NARCISO DARRYLE SAILOR on: 03/25/2024 04:23 PM     Modules accepted: Orders

## 2024-03-25 NOTE — Addendum Note (Signed)
 Addended by: Analisse Randle L on: 03/25/2024 04:23 PM     Modules accepted: Orders

## 2024-03-25 NOTE — Procedures (Signed)
 ORTHOPEDICS, [facility]  [address]  [place] [address]  Operated by [facility]  Procedure Note    Name: Monice Lundy MRN:  [id number]   Date: 03/25/2024 DOB:  [birth year] (68 y.o.)         Large Joint Arthrocentesis: L subacromial bursa  Indications: pain  Details: 21 G needle, posterior approach  Outcome: tolerated well, no immediate complications  Procedure, treatment alternatives, risks and benefits explained, specific risks discussed. Consent was given by the patient. Patient was prepped and draped in the usual sterile fashion.         Procedure Note Cortisone Injection to Shoulder  The Left shoulder was identified as the injection site. The risk and benefits of a cortisone injection were explained and the patient consented to the injection. Under sterile conditions, the left  shoulder into subacromial space was injected with a mixture of 80mg  of Kenelog and 4 mL of 1% Lidocaine  without complication. A sterile bandage was applied.   Bernardino Cunning, DO

## 2024-06-26 ENCOUNTER — Ambulatory Visit (HOSPITAL_COMMUNITY): Payer: Self-pay | Admitting: Student in an Organized Health Care Education/Training Program
